# Patient Record
Sex: Female | Born: 1995 | Race: White | Hispanic: No | Marital: Married | Smoking: Never smoker
Health system: Southern US, Community
[De-identification: ages and names within clinical notes are randomized; demographics above are authoritative.]

## PROBLEM LIST (undated history)

## (undated) DIAGNOSIS — K219 Gastro-esophageal reflux disease without esophagitis: Secondary | ICD-10-CM

## (undated) DIAGNOSIS — O2301 Infections of kidney in pregnancy, first trimester: Secondary | ICD-10-CM

## (undated) HISTORY — PX: NO PAST SURGERIES: SHX2092

---
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
## 2016-03-14 IMAGING — MR MR PELVIS W/O CM
5 of 8 series · 19 of 48 positions shown · non-contrast
Comparison: None.

CLINICAL DATA: Patient sent over from [HOSPITAL] for further
evaluation of right sided abdominal pain. Pt reports recent
hospitalization for kidney infection at [facility]. UA at [HOSPITAL] today normal per pt. PT reports sudden onset of abdominal and
back pain yesterday. Denies recent fever. Pt vomited x3 today. Pt
also 13 weeks preg.

EXAM:
MRI ABDOMEN AND PELVIS WITHOUT CONTRAST
TECHNIQUE: Multiplanar multisequence MR imaging of the abdomen and pelvis was
performed. No intravenous contrast was administered.

[Series 4: T2 fat-sat · axial · 6.0mm · 0.74mm/px · z∈[-199,+179]mm · 5 of 55 slices shown (1 of 2)]
[im 1/55]
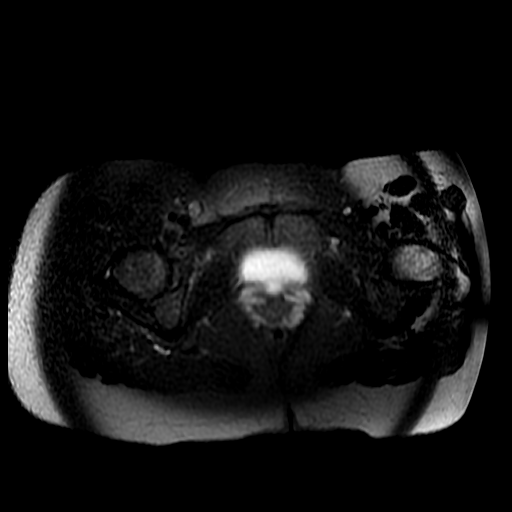
[im 14/55]
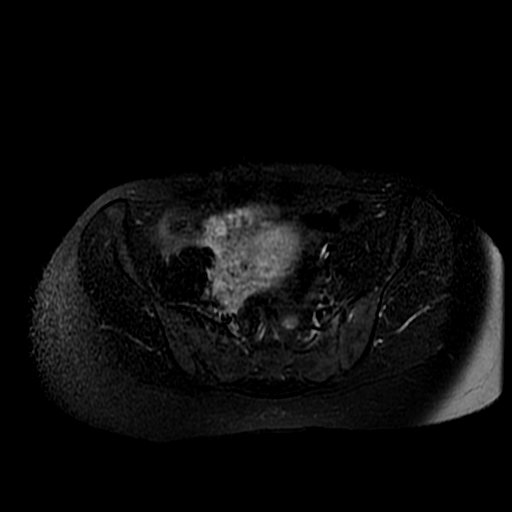
[im 28/55]
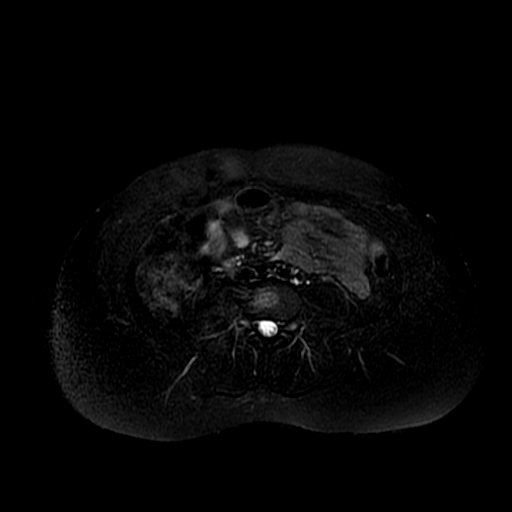
[im 41/55]
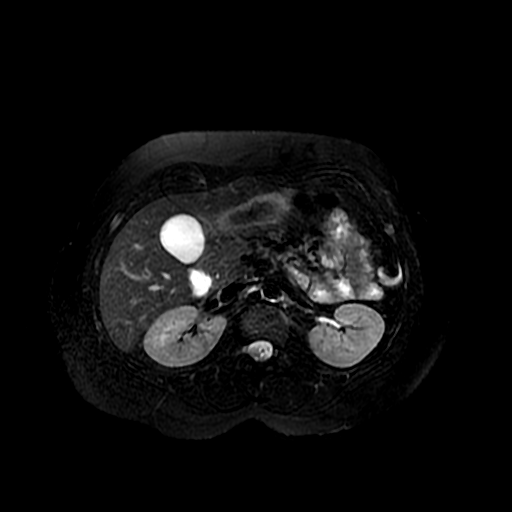
[im 55/55]
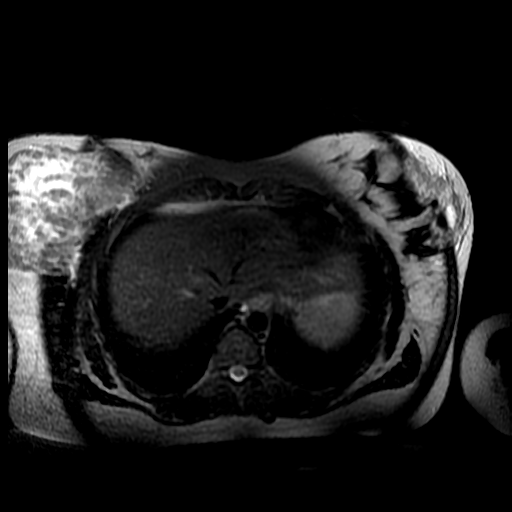

[Series 5: T2 fat-sat · axial · 6.0mm · 0.74mm/px · z∈[-199,+179]mm · 5 of 55 slices shown (2 of 2)]
[im 1/55]
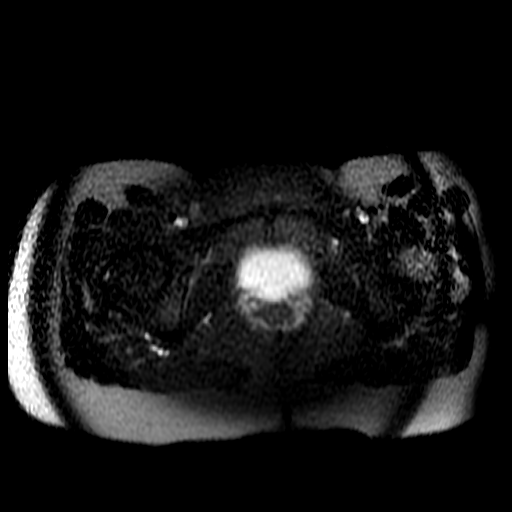
[im 14/55]
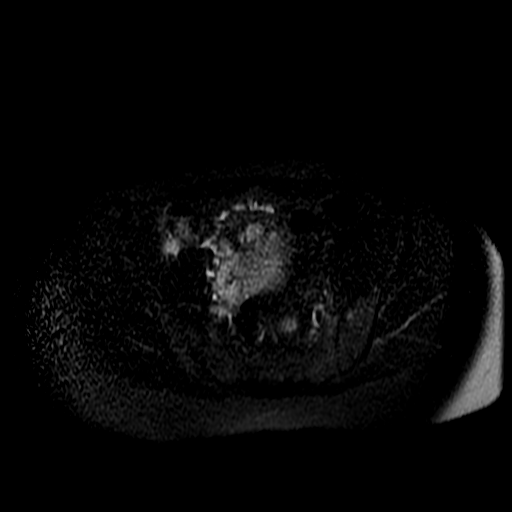
[im 28/55]
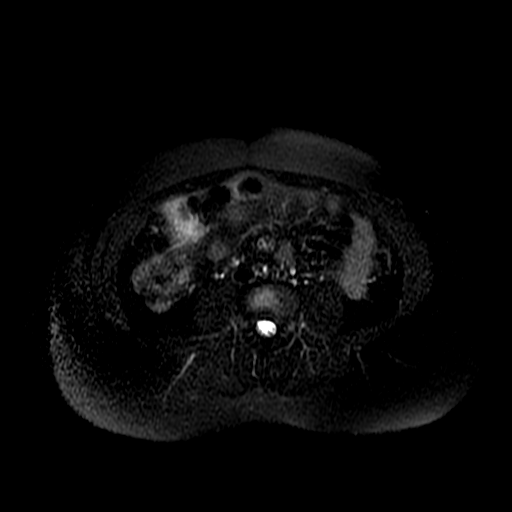
[im 41/55]
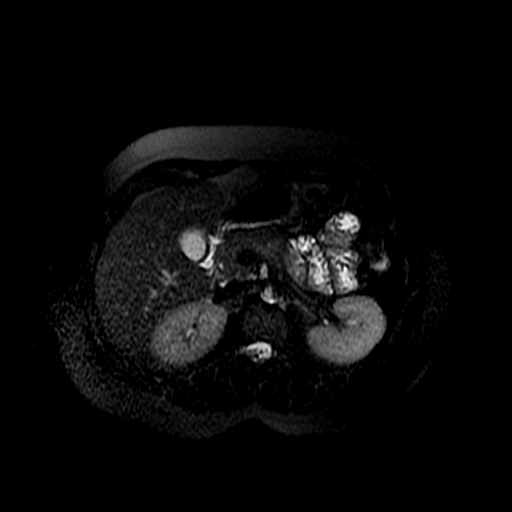
[im 55/55]
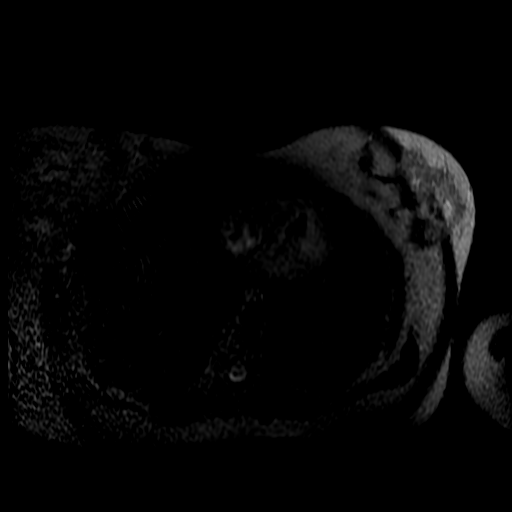

[Series 6: T2 · coronal · 10.0mm · 0.74mm/px · 2 of 21 slices shown]
[im 1/21]
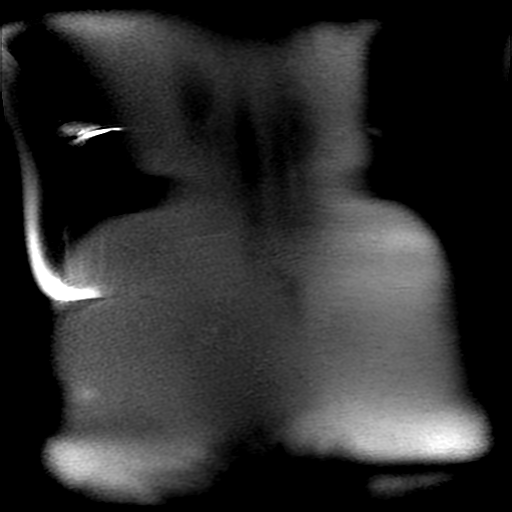
[im 21/21]
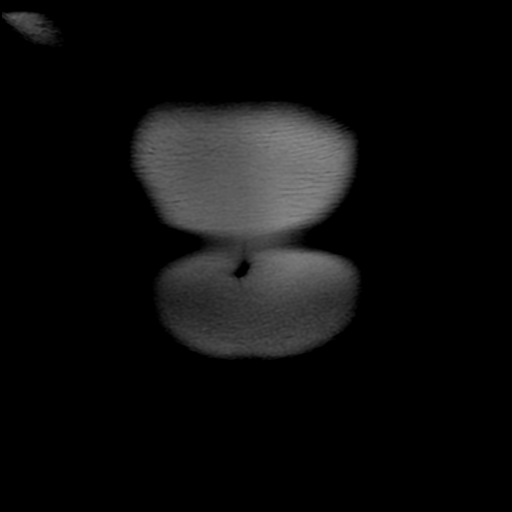

[Series 7: bSSFP · coronal · 10.0mm · 0.74mm/px · 2 of 21 slices shown]
[im 1/21]
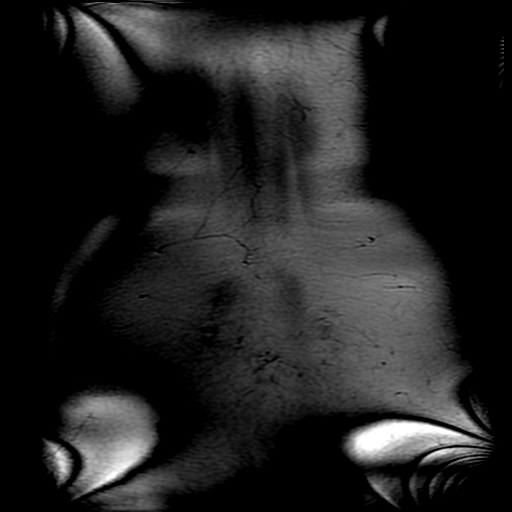
[im 21/21]
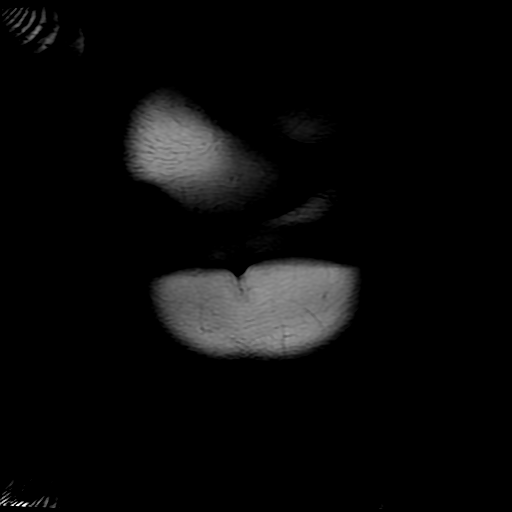

[Series 8: T1 dynamic · axial · 5.0mm · 0.78mm/px · z∈[-180,+27]mm · 5 of 156 slices shown]
[im 11/156]
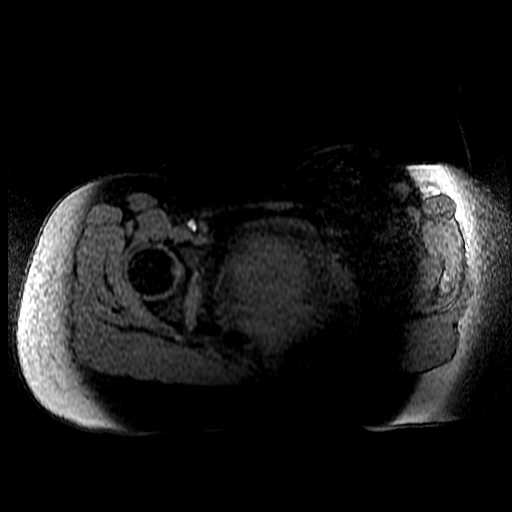
[im 32/156]
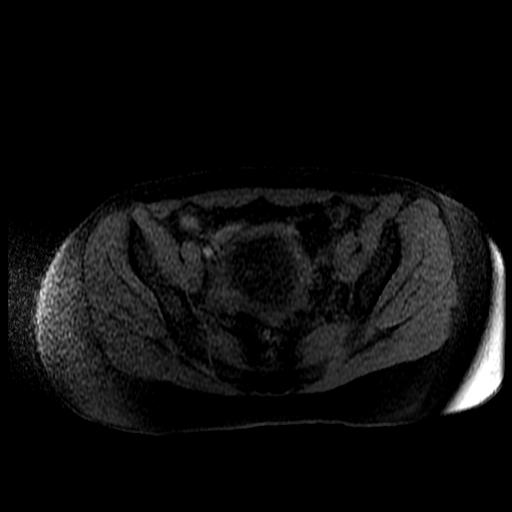
[im 52/156]
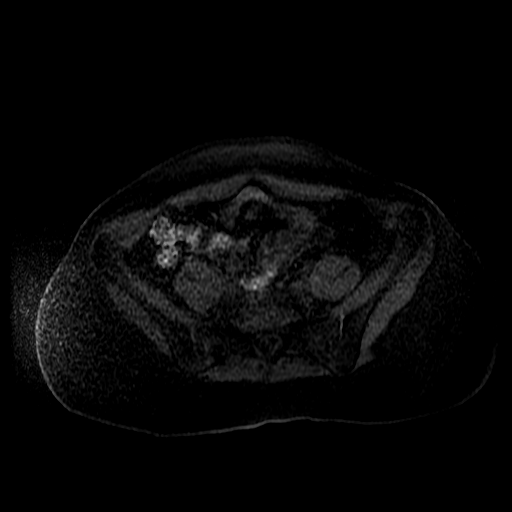
[im 73/156]
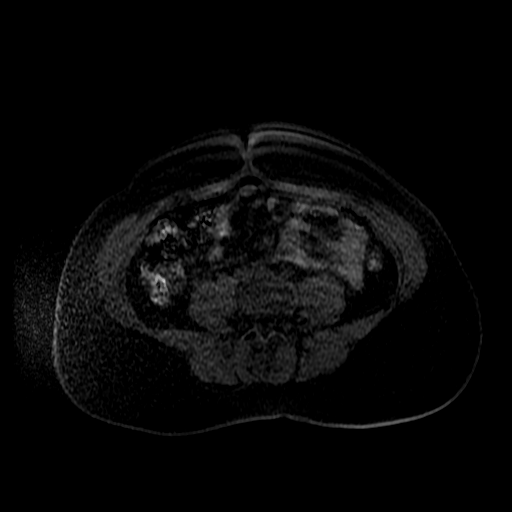
[im 94/156]
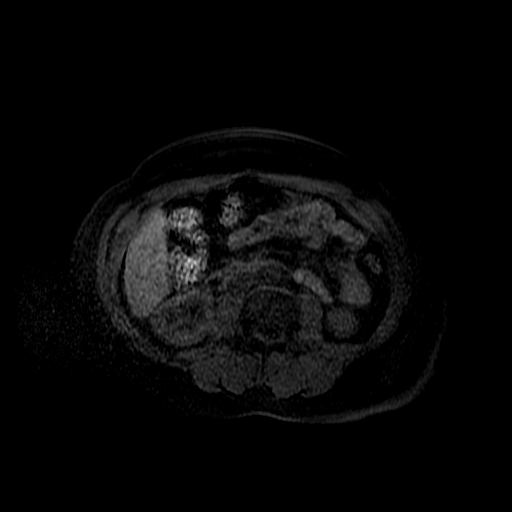

[19 of 48 positions shown; findings below may reference images not displayed]

FINDINGS: MRI ABDOMEN FINDINGS

Liver, spleen, gallbladder, pancreas, adrenal glands: Normal.

Normal kidneys.  No hydronephrosis.

Visualized bowel is unremarkable.  No adenopathy.  No ascites.

MRI PELVIS FINDINGS

Uterus enlarged by well-formed gestational sac containing an embryo.
Placenta is predominantly anterior. There is no evidence of a
subchorionic or retroplacental hemorrhage. No uterine masses.
Ovaries and adnexa are unremarkable.

The appendix is not definitively seen. There is no evidence acute
appendicitis or right lower quadrant inflammation.

Bowel in the pelvis is unremarkable. No pelvic masses or adenopathy.
No ascites. Visible portions of the ureters are normal in caliber.
Bladder is unremarkable.

MUSCULOSKELETAL

Normal.
IMPRESSION: 1. No evidence of acute appendicitis. Although the the absence
normal appendix is not discretely seen, inflammatory changes
adjacent to cecum is felt to exclude acute appendicitis.
2. No evidence of renal or ureteral stones or obstructive uropathy.
There are no findings to explain this patient's right-sided
abdominal pain and back pain.
3. Single live intrauterine pregnancy. No evidence of a pregnancy
complication.
4. Exam is normal for a 13 week pregnant patient.

## 2016-04-03 ENCOUNTER — Ambulatory Visit: Payer: Self-pay

## 2016-04-03 ENCOUNTER — Other Ambulatory Visit: Payer: Self-pay | Admitting: Occupational Medicine

## 2016-04-03 DIAGNOSIS — Z0000 Encounter for general adult medical examination without abnormal findings: Secondary | ICD-10-CM

## 2016-07-23 ENCOUNTER — Ambulatory Visit (INDEPENDENT_AMBULATORY_CARE_PROVIDER_SITE_OTHER): Payer: Managed Care, Other (non HMO) | Admitting: Obstetrics & Gynecology

## 2016-07-23 ENCOUNTER — Encounter: Payer: Self-pay | Admitting: Obstetrics & Gynecology

## 2016-07-23 VITALS — BP 110/60 | HR 93 | Ht 65.0 in | Wt 178.0 lb

## 2016-07-23 DIAGNOSIS — R1032 Left lower quadrant pain: Secondary | ICD-10-CM

## 2016-07-23 DIAGNOSIS — N939 Abnormal uterine and vaginal bleeding, unspecified: Secondary | ICD-10-CM

## 2016-07-23 NOTE — Patient Instructions (Signed)
Transvaginal Ultrasound A transvaginal ultrasound, also called an endovaginal ultrasound, is a test that uses harmless sound waves to take pictures of the female genital tract. The pictures are taken with a device, called a transducer, that is placed in the vagina. This test may be done to:  Check for problems with your pregnancy.  Examine your developing baby.  Check for anything abnormal in the uterus or ovaries.  Evaluate pelvic pain or bleeding. Tell a health care provider about:  Any allergies you have.  All medicines you are taking, including vitamins, herbs, eye drops, creams, and over-the-counter medicines.  Any blood disorders you have.  Any surgeries you have had.  Any medical conditions you have.  Whether you are pregnant or may be pregnant.  Whether you are having your period. What are the risks? There are no known risks or complications of having this test. What happens before the procedure?  This test needs to be done when your bladder is empty. Follow your health care provider's instructions about drinking fluids and emptying your bladder before the test. What happens during the procedure?  You will empty your bladder.  You will undress from the waist down.  You will lie down on an examining table, with your knees bent and your feet in foot holders.  A health care provider will cover the transducer with a sterile condom.  A gel will be put on the transducer. The gel helps transmit the sound waves and prevents irritation to your vagina.  The technician will insert the transducer into your vagina to get images. These will be displayed on a monitor that looks like a small television screen.  The transducer will be removed when the procedure is complete. What happens after the procedure?  It is your responsibility to get your test results. Ask your health care provider or the department performing the test when your results will be ready.  Keep follow-up  visits as told by your health care provider. This is important. This information is not intended to replace advice given to you by your health care provider. Make sure you discuss any questions you have with your health care provider. Document Released: 04/03/2004 Document Revised: 12/24/2015 Document Reviewed: 09/21/2014 Elsevier Interactive Patient Education  2017 Elsevier Inc.  

## 2016-07-23 NOTE — Progress Notes (Signed)
  History of Present Illness:  Anita Hurst is a 21 y.o. that had a Paragard IUD placed approximately 5 months ago. Since that time, she states that she has had some pain over the last 2 weeks;  LMP was Dec then last few days (h/o irreg periods).  No breast T, nausea, assoc sx's.  Cant feel string.  PMHx: She  has a past medical history of GERD (gastroesophageal reflux disease) and Pyelonephritis affecting pregnancy in first trimester, antepartum. Also,  has a past surgical history that includes No past surgeries., family history includes Breast cancer in her mother; Cancer in her mother; Diabetes in her maternal grandfather, maternal grandmother, and mother; Heart failure in her maternal grandmother; Ovarian cancer in her maternal grandmother.,  reports that she has never smoked. She has never used smokeless tobacco. She reports that she does not drink alcohol or use drugs.  She has a current medication list which includes the following prescription(s): copper and ranitidine. Also, is allergic to bee venom and sulfa antibiotics.  Review of Systems  Constitutional: Negative for chills, fever and malaise/fatigue.  HENT: Negative for congestion, sinus pain and sore throat.   Eyes: Negative for blurred vision and pain.  Respiratory: Negative for cough and wheezing.   Cardiovascular: Negative for chest pain and leg swelling.  Gastrointestinal: Negative for abdominal pain, constipation, diarrhea, heartburn, nausea and vomiting.  Genitourinary: Negative for dysuria, frequency, hematuria and urgency.  Musculoskeletal: Negative for back pain, joint pain, myalgias and neck pain.  Skin: Negative for itching and rash.  Neurological: Negative for dizziness, tremors and weakness.  Endo/Heme/Allergies: Does not bruise/bleed easily.  Psychiatric/Behavioral: Negative for depression. The patient is not nervous/anxious and does not have insomnia.     Physical Exam:  BP 110/60   Pulse 93   Ht 5\' 5"   (1.651 m)   Wt 178 lb (80.7 kg)   LMP 07/11/2016   Breastfeeding? Unknown   BMI 29.62 kg/m  Body mass index is 29.62 kg/m. Constitutional: Well nourished, well developed female in no acute distress.  Abdomen: diffusely non tender to palpation, non distended, and no masses, hernias Neuro: Grossly intact Psych:  Normal mood and affect.    Pelvic exam:  Two IUD strings present seen coming from the cervical os. EGBUS, vaginal vault and cervix: within normal limits  Assessment: IUD strings present in proper location; pt still has LLQ pain and irreg bleeding.  Does not want hormones.  Plan: TVUS to assess pain and bleeding.     She was told to continue to use barrier contraception, in order to prevent any STIs, and to take a home pregnancy test or call us if she ever thinks she may be pregnant, and that her IUD expires in 9 years.   Annamarie MajorPaul Muadh Creasy, MD, Merlinda FrederickFACOG [facility], [facility] 07/23/2016  11:15 AM

## 2016-07-29 ENCOUNTER — Ambulatory Visit (INDEPENDENT_AMBULATORY_CARE_PROVIDER_SITE_OTHER): Payer: Managed Care, Other (non HMO) | Admitting: Obstetrics & Gynecology

## 2016-07-29 ENCOUNTER — Ambulatory Visit (INDEPENDENT_AMBULATORY_CARE_PROVIDER_SITE_OTHER): Payer: Managed Care, Other (non HMO)

## 2016-07-29 ENCOUNTER — Encounter: Payer: Self-pay | Admitting: Obstetrics & Gynecology

## 2016-07-29 VITALS — BP 118/72 | HR 92 | Ht 66.0 in | Wt 180.0 lb

## 2016-07-29 DIAGNOSIS — R1032 Left lower quadrant pain: Secondary | ICD-10-CM

## 2016-07-29 DIAGNOSIS — N926 Irregular menstruation, unspecified: Secondary | ICD-10-CM

## 2016-07-29 DIAGNOSIS — N939 Abnormal uterine and vaginal bleeding, unspecified: Secondary | ICD-10-CM

## 2016-07-29 NOTE — Progress Notes (Signed)
  HPI: Abdominal Pain Patient presents for evaluation of abdominal pain. The pain is described as stabbing, and is 4/10 in intensity. Pain is located in the LLQ area without radiation. Onset was intermittent occurring 1 month ago. Symptoms have been intermittant since. Aggravating factors: getting US today, certain positions. Alleviating factors: recumbency. Associated symptoms: none. The patient denies constipation, diarrhea, dysuria, fever, frequency, nausea and vomiting. Risk factors for pelvic/abdominal pain include IUD in place.   Ultrasound demonstrates no masses seen, no cysts, and IUD ib proper orientation; scant FF in pelvis  These findings are Pelvis normal other than th efree fluid  PMHx: She  has a past medical history of GERD (gastroesophageal reflux disease) and Pyelonephritis affecting pregnancy in first trimester, antepartum. Also,  has a past surgical history that includes No past surgeries., family history includes Breast cancer in her mother; Cancer in her mother; Diabetes in her maternal grandfather, maternal grandmother, and mother; Heart failure in her maternal grandmother; Ovarian cancer in her maternal grandmother.,  reports that she has never smoked. She has never used smokeless tobacco. She reports that she does not drink alcohol or use drugs.  She has a current medication list which includes the following prescription(s): copper and ranitidine. Also, is allergic to bee venom and sulfa antibiotics.  Review of Systems  Constitutional: Negative for chills, fever and malaise/fatigue.  HENT: Negative for congestion, sinus pain and sore throat.   Eyes: Negative for blurred vision and pain.  Respiratory: Negative for cough and wheezing.   Cardiovascular: Negative for chest pain and leg swelling.  Gastrointestinal: Negative for abdominal pain, constipation, diarrhea, heartburn, nausea and vomiting.  Genitourinary: Negative for dysuria, frequency, hematuria and urgency.    Musculoskeletal: Negative for back pain, joint pain, myalgias and neck pain.  Skin: Negative for itching and rash.  Neurological: Negative for dizziness, tremors and weakness.  Endo/Heme/Allergies: Does not bruise/bleed easily.  Psychiatric/Behavioral: Negative for depression. The patient is not nervous/anxious and does not have insomnia.     Objective: BP 118/72 (BP Location: Right Arm, Patient Position: Sitting, Cuff Size: Normal)   Pulse 92   Ht 5\' 6"  (1.676 m)   Wt 180 lb (81.6 kg)   LMP 07/11/2016 (Exact Date)   Breastfeeding? No   BMI 29.05 kg/m   Physical examination Constitutional NAD, Conversant  Skin No rashes, lesions or ulceration.   Extremities: Moves all appropriately.  Normal ROM for age. No lymphadenopathy.  Neuro: Grossly intact  Psych: Oriented to PPT.  Normal mood. Normal affect.   Assessment:  Left lower quadrant pain MS pain as source; also dysmenorrhea with her irreg rare period cycles PT if persists and we cont to think MS as main etiology Heat, rest, Tylenol. Consider switch to hormonal IUD if periods always more related to pain she has, or if menorrhagia becomes a pattern. Cont Paraguard for now.  Annamarie MajorPaul Vicky Schleich, MD, Merlinda FrederickFACOG [facility], [facility] 07/29/2016  3:59 PM

## 2017-05-06 HISTORY — PX: WISDOM TOOTH EXTRACTION: SHX21

## 2017-10-26 IMAGING — US US ART/VEN ABD/PELV/SCROTUM DOPPLER LTD
1 series · 13 of 25 positions shown · non-contrast
Comparison: none

CLINICAL DATA: [REDACTED] weeks pregnant with right lower quadrant
pain. Rule out ovarian torsion.

EXAM:
LIMITED OBSTETRIC ULTRASOUND AN DOPPLER

[Series 1: us art/ven abd/pelv/scrotum doppler ltd · 0.23mm/px · 60 acquisitions, 13 frames shown]
[im 1/60]
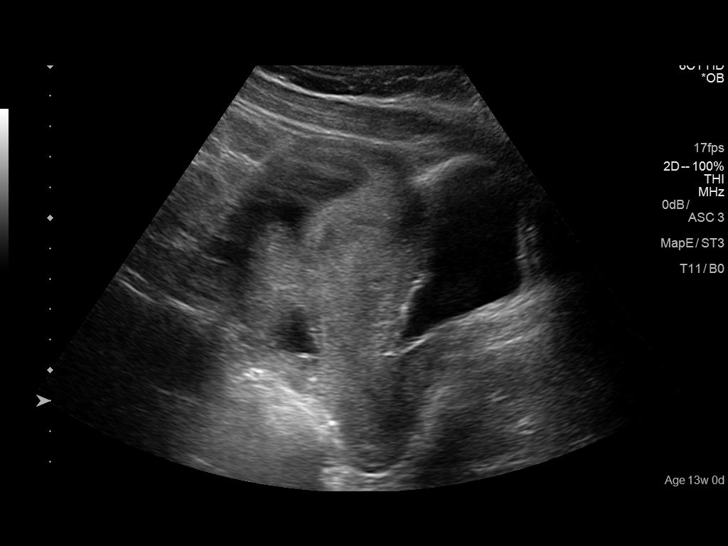
[im 5/60]
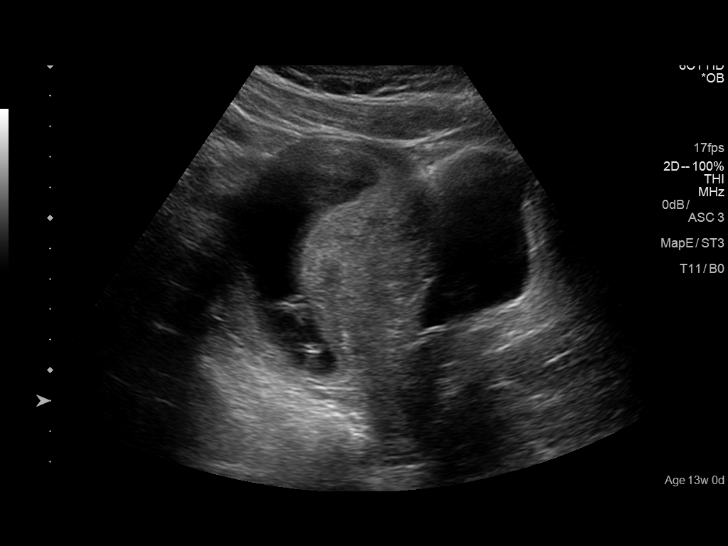
[im 10/60]
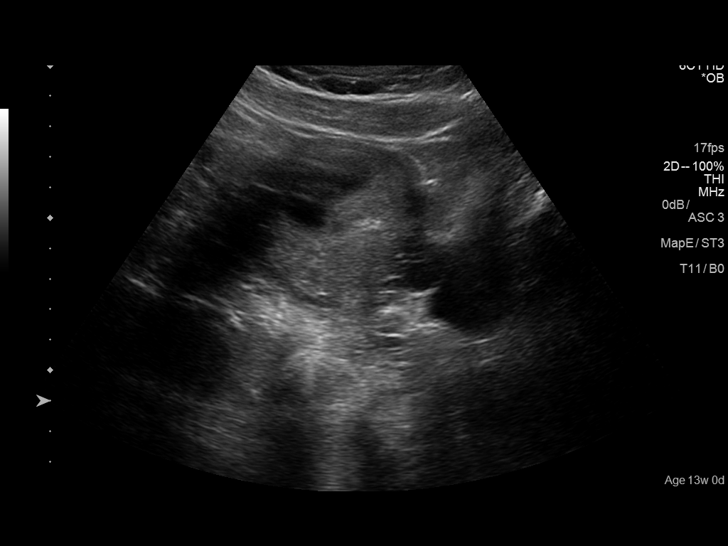
[im 15/60]
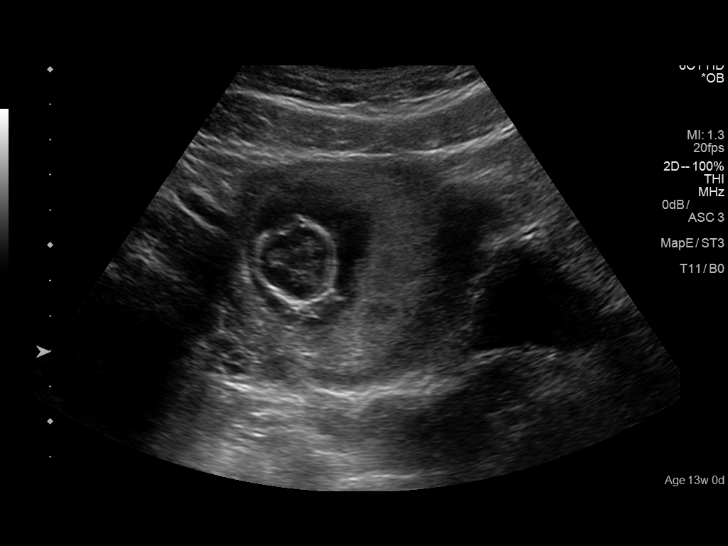
[im 20/60]
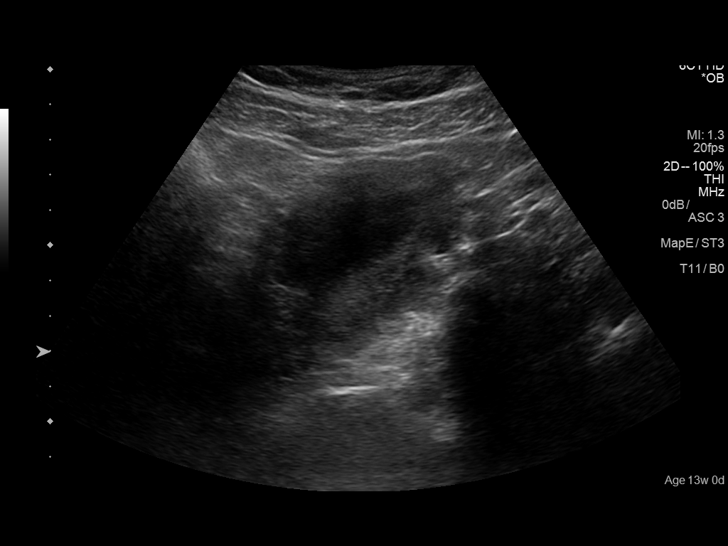
[im 25/60]
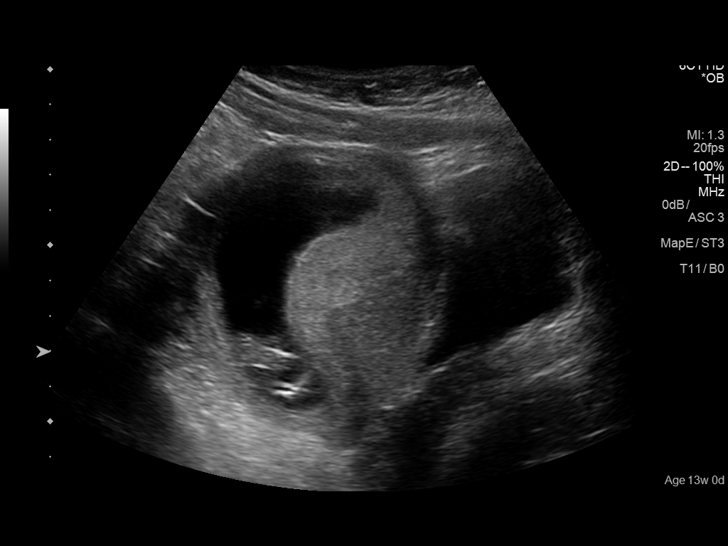
[im 30/60]
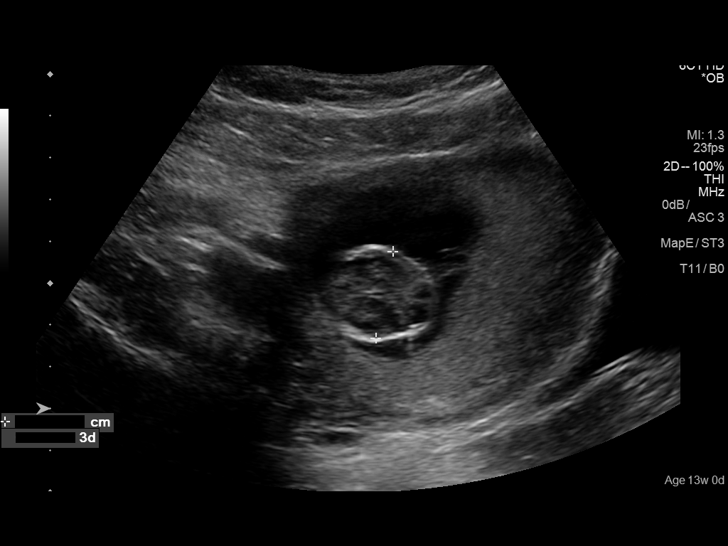
[im 35/60]
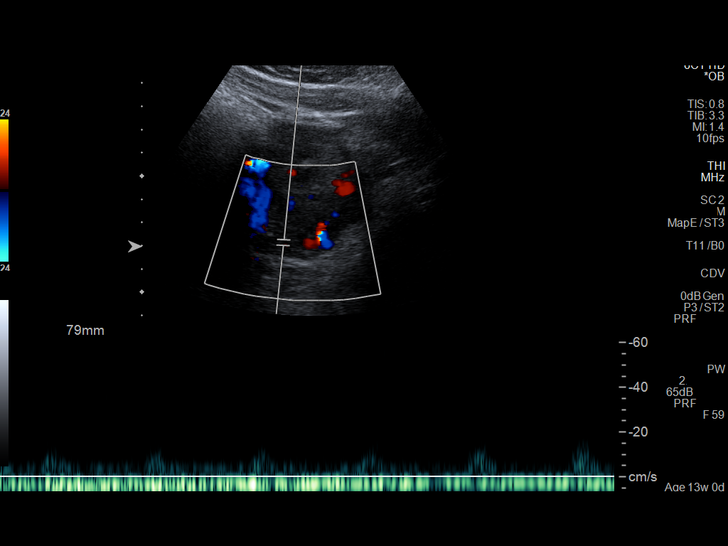
[im 40/60]
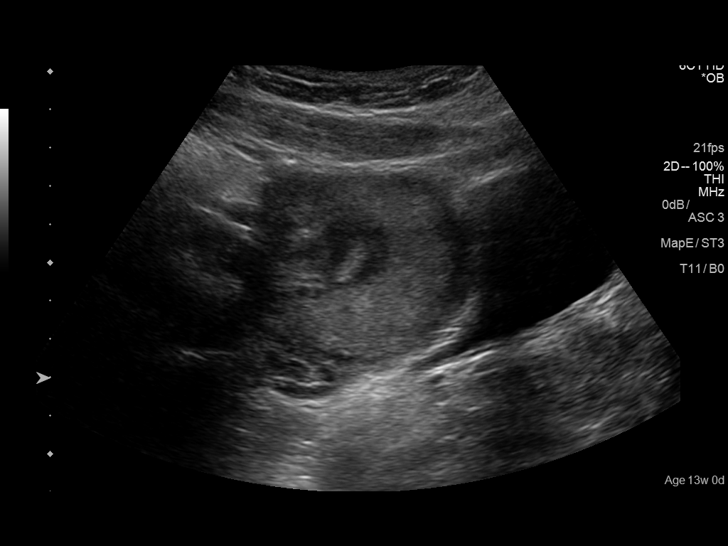
[im 45/60]
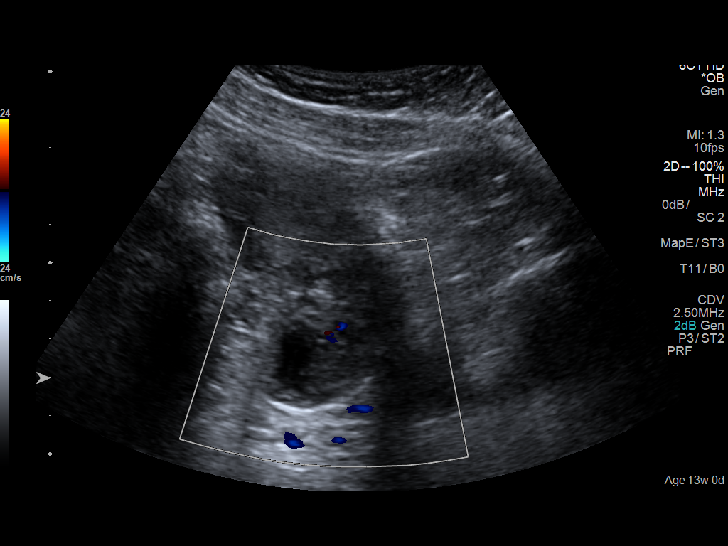
[im 50/60]
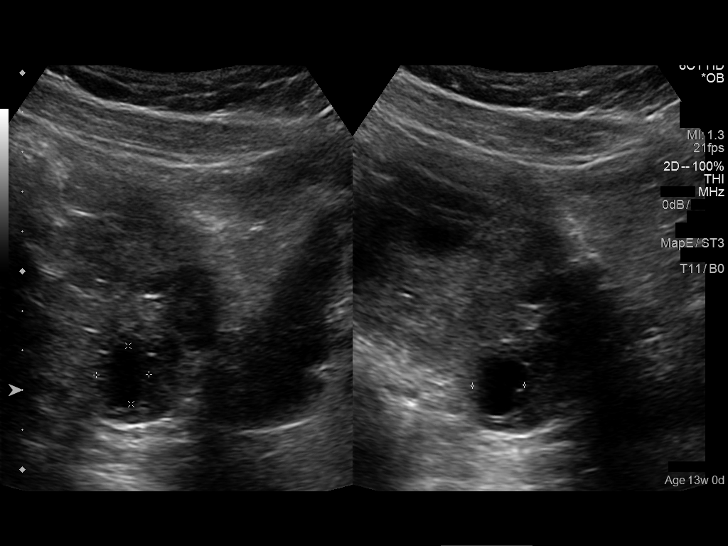
[im 55/60]
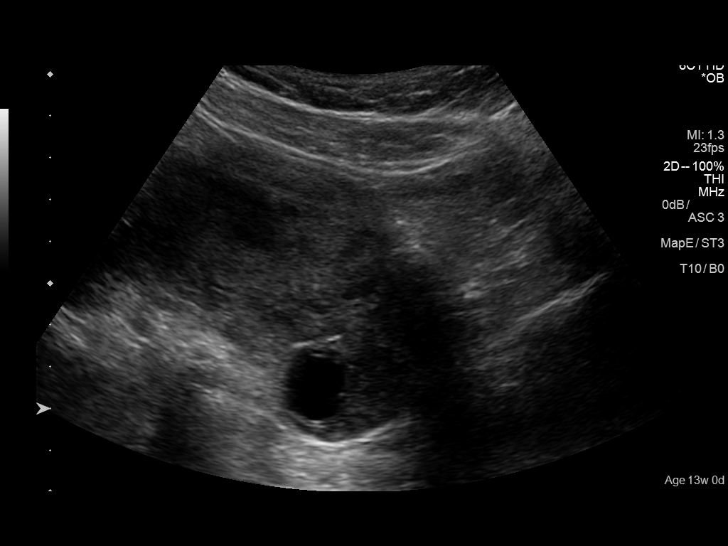
[im 60/60]
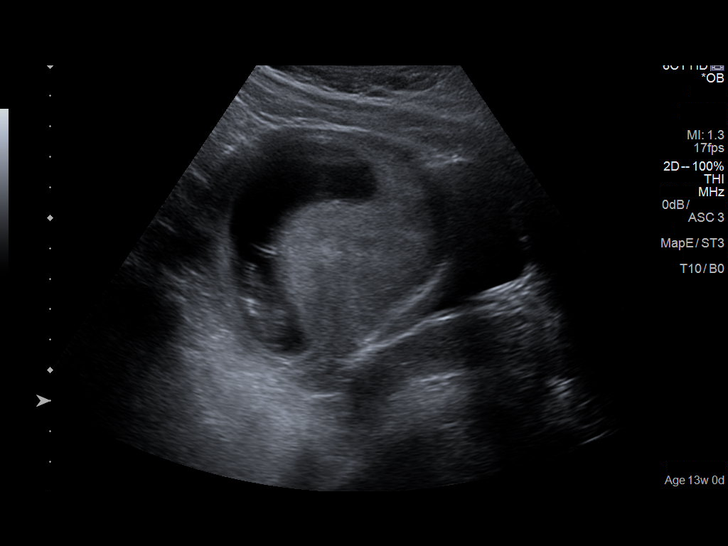

[13 of 25 positions shown; findings below may reference images not displayed]

FINDINGS: Number of Fetuses: 1

Heart Rate:  160 bpm

Movement: Yes

Presentation: Variable

Placental Location: Anterior

Previa: No

Amniotic Fluid (Subjective):  Within normal limits.

BPD:  2.1cm 13w  2d

MATERNAL FINDINGS:

Cervix:  Appears closed.

Uterus/Adnexae: Uterus measures 10.8 x 7.2 x 7.9 cm. Right ovary is
unremarkable measuring 3.8 x 2.3 x 2.7 cm. Left ovary is
unremarkable measuring 3.3 x 2.1 x 2.3 cm. Dominant, 15 mm, left
ovarian cyst.

Pulsed Doppler evaluation of both ovaries demonstrates normal
low-resistance arterial and venous waveforms.

Other findings

No free fluid.
IMPRESSION: 1. No acute findings.  Normal ovaries with no evidence of torsion.
2. Single live intrauterine pregnancy with a measured gestational
age of 13 weeks and 2 days consistent with the expected gestational
age based on the last menstrual period. No evidence of a pregnancy
complication.

This exam is performed on an emergent basis and does not
comprehensively evaluate fetal size, dating, or anatomy; follow-up
complete OB US should be considered if further fetal assessment is
warranted.

## 2018-03-30 ENCOUNTER — Telehealth: Payer: Self-pay

## 2018-03-30 NOTE — Telephone Encounter (Signed)
Pt has copper IUD and has been bleeding x3wks.  [phone].

## 2018-03-31 NOTE — Telephone Encounter (Signed)
Pt states she has had her iud for a year, had a period then has been spotting for 3 weeks, lite just there just when she wipes, this is the first time this has happened, pt states she can feel the iud strings, please advise

## 2018-04-01 NOTE — Telephone Encounter (Signed)
Spoke with patient. Reassurance given and recommended she call with worsening symptoms and come to clinic PRN.

## 2019-03-10 ENCOUNTER — Other Ambulatory Visit: Payer: Self-pay

## 2019-03-10 ENCOUNTER — Encounter: Payer: Self-pay | Admitting: Certified Nurse Midwife

## 2019-03-10 ENCOUNTER — Other Ambulatory Visit (HOSPITAL_COMMUNITY)
Admission: RE | Admit: 2019-03-10 | Discharge: 2019-03-10 | Disposition: A | Discharge status: Home / Self Care | Payer: Managed Care, Other (non HMO) | Source: Ambulatory Visit | Attending: Certified Nurse Midwife | Admitting: Certified Nurse Midwife

## 2019-03-10 ENCOUNTER — Ambulatory Visit: Payer: Managed Care, Other (non HMO) | Admitting: Certified Nurse Midwife

## 2019-03-10 VITALS — BP 125/98 | HR 100 | Ht 66.0 in | Wt 178.6 lb

## 2019-03-10 DIAGNOSIS — Z124 Encounter for screening for malignant neoplasm of cervix: Secondary | ICD-10-CM | POA: Diagnosis present

## 2019-03-10 DIAGNOSIS — Z803 Family history of malignant neoplasm of breast: Secondary | ICD-10-CM | POA: Insufficient documentation

## 2019-03-10 DIAGNOSIS — Z01419 Encounter for gynecological examination (general) (routine) without abnormal findings: Secondary | ICD-10-CM

## 2019-03-10 DIAGNOSIS — N390 Urinary tract infection, site not specified: Secondary | ICD-10-CM | POA: Diagnosis not present

## 2019-03-10 DIAGNOSIS — Z8759 Personal history of other complications of pregnancy, childbirth and the puerperium: Secondary | ICD-10-CM | POA: Diagnosis not present

## 2019-03-10 NOTE — Addendum Note (Signed)
Addended by: Hildred Priest on: 03/10/2019 04:35 PM   Modules accepted: Orders

## 2019-03-10 NOTE — Patient Instructions (Signed)
Preventive Care 27-22 Years Old, Female Preventive care refers to visits with your health care provider and lifestyle choices that can promote health and wellness. This includes:  A yearly physical exam. This may also be called an annual well check.  Regular dental visits and eye exams.  Immunizations.  Screening for certain conditions.  Healthy lifestyle choices, such as eating a healthy diet, getting regular exercise, not using drugs or products that contain nicotine and tobacco, and limiting alcohol use. What can I expect for my preventive care visit? Physical exam Your health care provider will check your:  Height and weight. This may be used to calculate body mass index (BMI), which tells if you are at a healthy weight.  Heart rate and blood pressure.  Skin for abnormal spots. Counseling Your health care provider may ask you questions about your:  Alcohol, tobacco, and drug use.  Emotional well-being.  Home and relationship well-being.  Sexual activity.  Eating habits.  Work and work Statistician.  Method of birth control.  Menstrual cycle.  Pregnancy history. What immunizations do I need?  Influenza (flu) vaccine  This is recommended every year. Tetanus, diphtheria, and pertussis (Tdap) vaccine  You may need a Td booster every 10 years. Varicella (chickenpox) vaccine  You may need this if you have not been vaccinated. Human papillomavirus (HPV) vaccine  If recommended by your health care provider, you may need three doses over 6 months. Measles, mumps, and rubella (MMR) vaccine  You may need at least one dose of MMR. You may also need a second dose. Meningococcal conjugate (MenACWY) vaccine  One dose is recommended if you are age 25-21 years and a first-year college student living in a residence hall, or if you have one of several medical conditions. You may also need additional booster doses. Pneumococcal conjugate (PCV13) vaccine  You may need  this if you have certain conditions and were not previously vaccinated. Pneumococcal polysaccharide (PPSV23) vaccine  You may need one or two doses if you smoke cigarettes or if you have certain conditions. Hepatitis A vaccine  You may need this if you have certain conditions or if you travel or work in places where you may be exposed to hepatitis A. Hepatitis B vaccine  You may need this if you have certain conditions or if you travel or work in places where you may be exposed to hepatitis B. Haemophilus influenzae type b (Hib) vaccine  You may need this if you have certain conditions. You may receive vaccines as individual doses or as more than one vaccine together in one shot (combination vaccines). Talk with your health care provider about the risks and benefits of combination vaccines. What tests do I need?  Blood tests  Lipid and cholesterol levels. These may be checked every 5 years starting at age 67.  Hepatitis C test.  Hepatitis B test. Screening  Diabetes screening. This is done by checking your blood sugar (glucose) after you have not eaten for a while (fasting).  Sexually transmitted disease (STD) testing.  BRCA-related cancer screening. This may be done if you have a family history of breast, ovarian, tubal, or peritoneal cancers.  Pelvic exam and Pap test. This may be done every 3 years starting at age 109. Starting at age 38, this may be done every 5 years if you have a Pap test in combination with an HPV test. Talk with your health care provider about your test results, treatment options, and if necessary, the need for more tests.  Follow these instructions at home: Eating and drinking   Eat a diet that includes fresh fruits and vegetables, whole grains, lean protein, and low-fat dairy.  Take vitamin and mineral supplements as recommended by your health care provider.  Do not drink alcohol if: ? Your health care provider tells you not to drink. ? You are  pregnant, may be pregnant, or are planning to become pregnant.  If you drink alcohol: ? Limit how much you have to 0-1 drink a day. ? Be aware of how much alcohol is in your drink. In the [place] one drink equals one 12 oz bottle of beer (355 mL), one 5 oz glass of wine (148 mL), or one 1 oz glass of hard liquor (44 mL). Lifestyle  Take daily care of your teeth and gums.  Stay active. Exercise for at least 30 minutes on 5 or more days each week.  Do not use any products that contain nicotine or tobacco, such as cigarettes, e-cigarettes, and chewing tobacco. If you need help quitting, ask your health care provider.  If you are sexually active, practice safe sex. Use a condom or other form of birth control (contraception) in order to prevent pregnancy and STIs (sexually transmitted infections). If you plan to become pregnant, see your health care provider for a preconception visit. What's next?  Visit your health care provider once a year for a well check visit.  Ask your health care provider how often you should have your eyes and teeth checked.  Stay up to date on all vaccines. This information is not intended to replace advice given to you by your health care provider. Make sure you discuss any questions you have with your health care provider. Document Released: 06/18/2001 Document Revised: 01/01/2018 Document Reviewed: 01/01/2018 Elsevier Patient Education  2020 Elsevier Inc.  

## 2019-03-10 NOTE — Progress Notes (Signed)
GYNECOLOGY ANNUAL PREVENTATIVE CARE ENCOUNTER NOTE  History:     Anita Hurst is a 23 y.o. G43P0101 female here for a routine annual gynecologic exam.  Current complaints:none   Denies abnormal vaginal bleeding, discharge, pelvic pain, problems with intercourse or other gynecologic concerns.    Gynecologic History Patient's last menstrual period was 02/05/2019 (approximate). Contraception: IUD Last Pap: 3 yrs ago. Results were: normal per pt Last mammogram: n/a , pt would like mammogram, pt mother passed away from breast cancer   Obstetric History OB History  Gravida Para Term Preterm AB Living  1 1   1   1   SAB TAB Ectopic Multiple Live Births          1    # Outcome Date GA Lbr Len/2nd Weight Sex Delivery Anes PTL Lv  1 Preterm 11/05/15 [redacted]w[redacted]d   M CS-LTranv  Y LIV    Past Medical History:  Diagnosis Date  . GERD (gastroesophageal reflux disease)   . Pyelonephritis affecting pregnancy in first trimester, antepartum     Past Surgical History:  Procedure Laterality Date  . NO PAST SURGERIES      Current Outpatient Medications on File Prior to Visit  Medication Sig Dispense Refill  . busPIRone (BUSPAR) 7.5 MG tablet Take 7.5 mg by mouth daily.    Marland Kitchen PARAGARD INTRAUTERINE COPPER IU by Intrauterine route.    . ranitidine (ZANTAC) 150 MG tablet Take by mouth.     No current facility-administered medications on file prior to visit.     Allergies  Allergen Reactions  . Bee Venom Hives and Rash  . Sulfa Antibiotics Hives and Rash    Social History:  reports that she has never smoked. She has never used smokeless tobacco. She reports that she does not drink alcohol or use drugs.  Family History  Problem Relation Age of Onset  . Cancer Mother        Breast  . Diabetes Mother   . Breast cancer Mother   . Diabetes Maternal Grandmother   . Heart failure Maternal Grandmother   . Ovarian cancer Maternal Grandmother   . Diabetes Maternal Grandfather     The  following portions of the patient's history were reviewed and updated as appropriate: allergies, current medications, past family history, past medical history, past social history, past surgical history and problem list.  Review of Systems Pertinent items noted in HPI and remainder of comprehensive ROS otherwise negative.  Physical Exam:  BP (!) 125/98   Pulse 100   Ht 5\' 6"  (1.676 m)   Wt 178 lb 9 oz (81 kg)   LMP 02/05/2019 (Approximate)   BMI 28.82 kg/m  CONSTITUTIONAL: Well-developed, well-nourished over weight female in no acute distress.  HENT:  Normocephalic, atraumatic, External right and left ear normal. Oropharynx is clear and moist EYES: Conjunctivae and EOM are normal. Pupils are equal, round, and reactive to light. No scleral icterus.  NECK: Normal range of motion, supple, no masses.  Normal thyroid.  SKIN: Skin is warm and dry. No rash noted. Not diaphoretic. No erythema. No pallor. MUSCULOSKELETAL: Normal range of motion. No tenderness.  No cyanosis, clubbing, or edema.  2+ distal pulses. NEUROLOGIC: Alert and oriented to person, place, and time. Normal reflexes, muscle tone coordination. No cranial nerve deficit noted. PSYCHIATRIC: Normal mood and affect. Normal behavior. Normal judgment and thought content. CARDIOVASCULAR: Normal heart rate noted, regular rhythm RESPIRATORY: Clear to auscultation bilaterally. Effort and breath sounds normal, no problems with  respiration noted. BREASTS: Symmetric in size. No masses, skin changes, nipple drainage, or lymphadenopathy. ABDOMEN: Soft, normal bowel sounds, no distention noted.  No tenderness, rebound or guarding.  PELVIC: Normal appearing external genitalia; normal appearing vaginal mucosa and cervix.  No abnormal discharge noted.  Pap smear obtained.  Normal uterine size, no other palpable masses, no uterine or adnexal tenderness.   Assessment and Plan:  Annual Well Women GYN Exam  Will follow up results of pap smear and  manage accordingly. 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 with last pregnancy. She asked about reoccurrence. All questions answered.  Routine preventative health maintenance measures emphasized. Please refer to After Visit Summary for other counseling recommendations.      Doreene Burke, CNM

## 2019-03-12 LAB — CYTOLOGY - PAP: Diagnosis: NEGATIVE

## 2019-04-16 ENCOUNTER — Telehealth: Payer: Self-pay

## 2019-04-16 NOTE — Telephone Encounter (Signed)
[facility] called to inform the doctor that patient is scheduled for genetic counseling on Dec on 27th. Please call [facility] once counseling is complete. Submit specimen [id number]

## 2019-04-27 ENCOUNTER — Other Ambulatory Visit: Payer: Self-pay | Admitting: Certified Nurse Midwife

## 2019-04-27 DIAGNOSIS — Z1231 Encounter for screening mammogram for malignant neoplasm of breast: Secondary | ICD-10-CM

## 2019-05-05 DIAGNOSIS — Z1231 Encounter for screening mammogram for malignant neoplasm of breast: Secondary | ICD-10-CM

## 2019-06-04 ENCOUNTER — Ambulatory Visit: Payer: Managed Care, Other (non HMO) | Admitting: Certified Nurse Midwife

## 2019-08-02 ENCOUNTER — Other Ambulatory Visit: Payer: Self-pay

## 2019-08-02 ENCOUNTER — Encounter: Payer: Self-pay | Admitting: Certified Nurse Midwife

## 2019-08-02 ENCOUNTER — Ambulatory Visit: Payer: Managed Care, Other (non HMO) | Admitting: Certified Nurse Midwife

## 2019-08-02 VITALS — BP 119/89 | HR 65 | Ht 66.0 in | Wt 179.3 lb

## 2019-08-02 DIAGNOSIS — Z30432 Encounter for removal of intrauterine contraceptive device: Secondary | ICD-10-CM | POA: Diagnosis not present

## 2019-08-02 NOTE — Patient Instructions (Signed)

## 2019-08-02 NOTE — Progress Notes (Signed)
   GYNECOLOGY OFFICE PROCEDURE NOTE  Anita Hurst is a 24 y.o. G1P0101 here for para gaurd IUD removal. No GYN concerns.  Last pap smear was on 03/10/2019 and was normal.  IUD Removal  Patient identified, informed consent performed, consent signed.  Patient was in the dorsal lithotomy position, normal external genitalia was noted.  A speculum was placed in the patient's vagina, normal discharge was noted, no lesions. The cervix was visualized, no lesions, no abnormal discharge.  The strings of the IUD were grasped and pulled using ring forceps. The IUD was removed in its entirety. ).  Patient tolerated the procedure well.    The patient plans for pregnancy soon and she was told to avoid teratogens, take PNV and folic acid.  Routine preventative health maintenance measures emphasized.   Doreene Burke, CNM

## 2019-09-06 ENCOUNTER — Encounter: Payer: Managed Care, Other (non HMO) | Admitting: Certified Nurse Midwife

## 2019-10-05 ENCOUNTER — Encounter: Payer: Self-pay | Admitting: Certified Nurse Midwife

## 2019-10-05 ENCOUNTER — Ambulatory Visit (INDEPENDENT_AMBULATORY_CARE_PROVIDER_SITE_OTHER): Payer: Managed Care, Other (non HMO) | Admitting: Certified Nurse Midwife

## 2019-10-05 ENCOUNTER — Other Ambulatory Visit: Payer: Self-pay

## 2019-10-05 VITALS — BP 118/85 | HR 85 | Ht 66.0 in | Wt 181.2 lb

## 2019-10-05 DIAGNOSIS — Z01419 Encounter for gynecological examination (general) (routine) without abnormal findings: Secondary | ICD-10-CM | POA: Diagnosis not present

## 2019-10-05 NOTE — Patient Instructions (Signed)

## 2019-10-05 NOTE — Progress Notes (Signed)
GYNECOLOGY ANNUAL PREVENTATIVE CARE ENCOUNTER NOTE  History:     Anita Hurst is a 24 y.o. G69P0101 female here for a routine annual gynecologic exam.  Current complaints: trying to concieve.   Denies abnormal vaginal bleeding, discharge, pelvic pain, problems with intercourse or other gynecologic concerns.     Social  Relationship;married with child  Living: husband and child Work: FT Teacher, English as a foreign language  Exercise: jogging 3 x wk x 2 miles, swimming  Smoke/vape: denies  Alcohol: occasional  Drug use: none  Gynecologic History No LMP recorded. (Menstrual status: IUD). Contraception: none Last Pap: 03/10/19. Results were: normal  Last mammogram: n/a  Obstetric History OB History  Gravida Para Term Preterm AB Living  1 1   1   1   SAB TAB Ectopic Multiple Live Births          1    # Outcome Date GA Lbr Len/2nd Weight Sex Delivery Anes PTL Lv  1 Preterm 11/05/15 [redacted]w[redacted]d   M CS-LTranv  Y LIV    Past Medical History:  Diagnosis Date  . GERD (gastroesophageal reflux disease)   . Pyelonephritis affecting pregnancy in first trimester, antepartum     Past Surgical History:  Procedure Laterality Date  . CESAREAN SECTION    . NO PAST SURGERIES      Current Outpatient Medications on File Prior to Visit  Medication Sig Dispense Refill  . busPIRone (BUSPAR) 7.5 MG tablet Take 7.5 mg by mouth daily.    Marland Kitchen PARAGARD INTRAUTERINE COPPER IU by Intrauterine route.    . ranitidine (ZANTAC) 150 MG tablet Take by mouth.     No current facility-administered medications on file prior to visit.    Allergies  Allergen Reactions  . Bee Venom Hives and Rash  . Sulfa Antibiotics Hives and Rash    Social History:  reports that she has never smoked. She has never used smokeless tobacco. She reports that she does not drink alcohol or use drugs.  Family History  Problem Relation Age of Onset  . Cancer Mother        Breast  . Diabetes Mother   . Breast cancer Mother   .  Diabetes Maternal Grandmother   . Heart failure Maternal Grandmother   . Ovarian cancer Maternal Grandmother   . Diabetes Maternal Grandfather   . Diabetes Father   . Breast cancer Maternal Aunt     The following portions of the patient's history were reviewed and updated as appropriate: allergies, current medications, past family history, past medical history, past social history, past surgical history and problem list.  Review of Systems Pertinent items noted in HPI and remainder of comprehensive ROS otherwise negative.  Physical Exam:  There were no vitals taken for this visit. CONSTITUTIONAL: Well-developed, well-nourished female in no acute distress.  HENT:  Normocephalic, atraumatic, External right and left ear normal. Oropharynx is clear and moist EYES: Conjunctivae and EOM are normal. Pupils are equal, round, and reactive to light. No scleral icterus.  NECK: Normal range of motion, supple, no masses.  Normal thyroid.  SKIN: Skin is warm and dry. No rash noted. Not diaphoretic. No erythema. No pallor. MUSCULOSKELETAL: Normal range of motion. No tenderness.  No cyanosis, clubbing, or edema.  2+ distal pulses. NEUROLOGIC: Alert and oriented to person, place, and time. Normal reflexes, muscle tone coordination.  PSYCHIATRIC: Normal mood and affect. Normal behavior. Normal judgment and thought content. CARDIOVASCULAR: Normal heart rate noted, regular rhythm RESPIRATORY: Clear to auscultation bilaterally. Effort and breath  sounds normal, no problems with respiration noted. BREASTS: Symmetric in size. No masses, tenderness, skin changes, nipple drainage, or lymphadenopathy bilaterally. Performed in the presence of a chaperone. ABDOMEN: Soft, no distention noted.  No tenderness, rebound or guarding.  PELVIC: Normal appearing external genitalia and urethral meatus; normal appearing vaginal mucosa and cervix.  No abnormal discharge noted.  Pap smear obtained.  Normal uterine size, no other  palpable masses, no uterine or adnexal tenderness.  Performed in the presence of a chaperone.   Assessment and Plan:    There are no diagnoses linked to this encounter. Pap smear not indicated Mammogram not indicated Lab: none has done through work  Refill: none  Referrals: none Discussed use of ovulation kt if not conceiving, follow up 3-6 months if unable to get pregnant to discuss use of clomid.  Routine preventative health maintenance measures emphasized. Please refer to After Visit Summary for other counseling recommendations.      Doreene Burke, CNM

## 2019-10-18 ENCOUNTER — Other Ambulatory Visit: Payer: Self-pay

## 2019-10-18 ENCOUNTER — Ambulatory Visit (INDEPENDENT_AMBULATORY_CARE_PROVIDER_SITE_OTHER): Payer: Managed Care, Other (non HMO) | Admitting: Otolaryngology

## 2019-10-18 ENCOUNTER — Encounter (INDEPENDENT_AMBULATORY_CARE_PROVIDER_SITE_OTHER): Payer: Self-pay | Admitting: Otolaryngology

## 2019-10-18 VITALS — Temp 98.2°F

## 2019-10-18 DIAGNOSIS — J310 Chronic rhinitis: Secondary | ICD-10-CM

## 2019-10-18 NOTE — Progress Notes (Signed)
HPI: Anita Hurst is a 24 y.o. female who presents is referred by [facility] urgent care for evaluation of dizziness.  Symptoms apparently initially started with a right earache after she went swimming and [place].  She has had history of swimmers ear in the past.  She was treated with antibiotic eardrops as well as Augmentin 875 mg twice daily for 10 days.  About 2 weeks ago while she was driving she turned her head quickly and heard a pop in her right ear and had some dizziness that has felt persistent.  She does not really describe vertigo but more of a nonspecific dizziness.  She feels like she is dizzy in the office today although she has no spinning sensation no nystagmus.  She also feels like she might have water in the left ear and some pain and discomfort in the right ear.  She does have history of allergies and has taken azelastine but is not using it presently.  She tried Flonase years ago but apparently had a nosebleed associated with the Flonase.Marland Kitchen  Past Medical History:  Diagnosis Date  . GERD (gastroesophageal reflux disease)   . Pyelonephritis affecting pregnancy in first trimester, antepartum    Past Surgical History:  Procedure Laterality Date  . CESAREAN SECTION    . NO PAST SURGERIES     Social History   Socioeconomic History  . Marital status: Married    Spouse name: Not on file  . Number of children: Not on file  . Years of education: Not on file  . Highest education level: Not on file  Occupational History  . Not on file  Tobacco Use  . Smoking status: Never Smoker  . Smokeless tobacco: Never Used  Substance and Sexual Activity  . Alcohol use: Yes  . Drug use: No  . Sexual activity: Yes    Birth control/protection: None  Other Topics Concern  . Not on file  Social History Narrative  . Not on file   Social Determinants of Health   Financial Resource Strain:   . Difficulty of Paying Living Expenses:   Food Insecurity:    . Worried About Programme researcher, broadcasting/film/video in the Last Year:   . Barista in the Last Year:   Transportation Needs:   . Freight forwarder (Medical):   Marland Kitchen Lack of Transportation (Non-Medical):   Physical Activity:   . Days of Exercise per Week:   . Minutes of Exercise per Session:   Stress:   . Feeling of Stress :   Social Connections:   . Frequency of Communication with Friends and Family:   . Frequency of Social Gatherings with Friends and Family:   . Attends Religious Services:   . Active Member of Clubs or Organizations:   . Attends Banker Meetings:   Marland Kitchen Marital Status:    Family History  Problem Relation Age of Onset  . Cancer Mother        Breast  . Diabetes Mother   . Breast cancer Mother   . Diabetes Maternal Grandmother   . Heart failure Maternal Grandmother   . Ovarian cancer Maternal Grandmother   . Diabetes Maternal Grandfather   . Diabetes Father   . Heart attack Father   . Breast cancer Maternal Aunt    Allergies  Allergen Reactions  . Bee Venom Hives and Rash  . Sulfa Antibiotics Hives and Rash   Prior to Admission medications  Medication Sig Start Date End Date Taking? Authorizing Provider  busPIRone (BUSPAR) 7.5 MG tablet Take 7.5 mg by mouth daily.   Yes [provider]  PARAGARD INTRAUTERINE COPPER IU by Intrauterine route.   Yes [provider]  ranitidine (ZANTAC) 150 MG tablet Take by mouth. 11/08/15 11/07/16  [provider]     Positive ROS: Otherwise negative  All other systems have been reviewed and were otherwise negative with the exception of those mentioned in the HPI and as above.  Physical Exam: Constitutional: Alert, well-appearing, no acute distress Ears: External ears without lesions or tenderness.  Ear canals are clear bilaterally with no evidence of external otitis.  TMs are clear with good mobility on pneumatic otoscopy.  On hearing screening with the 512 and 1024 tuning fork she hears  well in both ears with AC > BC bilaterally.  Dix-Hallpike testing revealed no evidence of BPPV. Nasal: External nose without lesions. Septum midline with moderate rhinitis.  Both the middle meatus regions were clear with no clinical signs of infection with only clear mucus discharge..  Oral: Lips and gums without lesions. Tongue and palate mucosa without lesions. Posterior oropharynx clear. Neck: No palpable adenopathy or masses Respiratory: Breathing comfortably  Skin: No facial/neck lesions or rash noted.  Procedures  Assessment: Presently no signs of persistent external otitis.  Both TMs are clear. Chronic rhinitis with questionable eustachian tube dysfunction. Dizziness questionable etiology may be related to "sinus pressure".  Plan: Recommended use of Nasacort 2 sprays each nostril at night as this will help not only with sinus pressure as well as eustachian tube function. Discussed with her concerning using alcohol vinegar ear rinses when she gets water in her ears to help prevent swimmer's ear in the future.   Radene Journey, MD   CC:

## 2019-12-20 ENCOUNTER — Encounter: Payer: Self-pay | Admitting: Certified Nurse Midwife

## 2019-12-20 ENCOUNTER — Ambulatory Visit: Payer: Managed Care, Other (non HMO) | Admitting: Certified Nurse Midwife

## 2019-12-20 ENCOUNTER — Other Ambulatory Visit: Payer: Self-pay

## 2019-12-20 VITALS — BP 117/82 | HR 80 | Ht 66.0 in | Wt 181.4 lb

## 2019-12-20 DIAGNOSIS — N926 Irregular menstruation, unspecified: Secondary | ICD-10-CM | POA: Diagnosis not present

## 2019-12-20 DIAGNOSIS — R42 Dizziness and giddiness: Secondary | ICD-10-CM | POA: Diagnosis not present

## 2019-12-20 NOTE — Progress Notes (Addendum)
GYN ENCOUNTER NOTE  Subjective:       Anita Hurst is a 24 y.o. G72P0101 female is here for gynecologic evaluation of the following issues:  1. Irregular period, trying to get pregnant. Her para guard  IUD was removed in march. She had 2 normal periods. She had no period in June or July and has only had light spotting for 2 days august. She has also had dizziness that she has been treated for but has not had any blood work for .   Gynecologic History Patient's last menstrual period was 09/28/2019 (exact date). Contraception: none Last Pap: 03/10/19. Results were: normal Last mammogram: n/a   Obstetric History OB History  Gravida Para Term Preterm AB Living  _0 SAB TAB Ectopic Multiple Live Births          1    # Outcome Date GA Lbr Len/2nd Weight Sex Delivery Anes PTL Lv  1 Preterm 11/05/15 [redacted]w[redacted]d  M CS-LTranv  Y LIV    Past Medical History:  Diagnosis Date   GERD (gastroesophageal reflux disease)    Pyelonephritis affecting pregnancy in first trimester, antepartum     Past Surgical History:  Procedure Laterality Date   CESAREAN SECTION     NO PAST SURGERIES      Current Outpatient Medications on File Prior to Visit  Medication Sig Dispense Refill   busPIRone (BUSPAR) 7.5 MG tablet Take 7.5 mg by mouth daily.     meclizine (ANTIVERT) 25 MG tablet Take 25 mg by mouth 3 (three) times daily as needed.     ranitidine (ZANTAC) 150 MG tablet Take by mouth.     No current facility-administered medications on file prior to visit.    Allergies  Allergen Reactions   Bee Venom Hives and Rash   Sulfa Antibiotics Hives and Rash    Social History   Socioeconomic History   Marital status: Married    Spouse name: Not on file   Number of children: Not on file   Years of education: Not on file   Highest education level: Not on file  Occupational History   Not on file  Tobacco Use   Smoking status: Never Smoker   Smokeless tobacco: Never  Used  Substance and Sexual Activity   Alcohol use: Yes   Drug use: No   Sexual activity: Yes    Birth control/protection: None  Other Topics Concern   Not on file  Social History Narrative   Not on file   Social Determinants of Health   Financial Resource Strain:    Difficulty of Paying Living Expenses:   Food Insecurity:    Worried About RCharity fundraiserin the Last Year:    RArboriculturistin the Last Year:   Transportation Needs:    LFilm/video editor(Medical):    Lack of Transportation (Non-Medical):   Physical Activity:    Days of Exercise per Week:    Minutes of Exercise per Session:   Stress:    Feeling of Stress :   Social Connections:    Frequency of Communication with Friends and Family:    Frequency of Social Gatherings with Friends and Family:    Attends Religious Services:    Active Member of Clubs or Organizations:    Attends CArchivistMeetings:    Marital Status:   Intimate Partner Violence:    Fear of Current or Ex-Partner:  Emotionally Abused:    Physically Abused:    Sexually Abused:     Family History  Problem Relation Age of Onset   Cancer Mother        Breast   Diabetes Mother    Breast cancer Mother    Diabetes Maternal Grandmother    Heart failure Maternal Grandmother    Ovarian cancer Maternal Grandmother    Diabetes Maternal Grandfather    Diabetes Father    Heart attack Father    Breast cancer Maternal Aunt     The following portions of the patient's history were reviewed and updated as appropriate: allergies, current medications, past family history, past medical history, past social history, past surgical history and problem list.  Review of Systems Review of Systems - Negative except as mentioend in hpi Review of Systems - General ROS: negative for - chills, fatigue, fever, hot flashes, malaise or night sweats Hematological and Lymphatic ROS: negative for - bleeding problems  or swollen lymph nodes Gastrointestinal ROS: negative for - abdominal pain, blood in stools, change in bowel habits and nausea/vomiting Musculoskeletal ROS: negative for - joint pain, muscle pain or muscular weakness Genito-Urinary ROS: negative for - change in menstrual cycle, dysmenorrhea, dyspareunia, dysuria, genital discharge, genital ulcers, hematuria, incontinence,heavy menses, nocturia or pelvic pain. Positive for irregular period   Objective:   BP 117/82    Pulse 80    Ht _0  (1.676 m)    Wt 181 lb 7 oz (82.3 kg)    LMP 09/28/2019 (Exact Date) Comment: spotted 12/16/19-12/18/19   BMI 29.28 kg/m  CONSTITUTIONAL: Well-developed, well-nourished female in no acute distress.  HENT:  Normocephalic, atraumatic.  NECK: Normal range of motion, supple, no masses.  Normal thyroid.  SKIN: Skin is warm and dry. No rash noted. Not diaphoretic. No erythema. No pallor. [place]: Alert and oriented to person, place, and time. PSYCHIATRIC: Normal mood and affect. Normal behavior. Normal judgment and thought content. CARDIOVASCULAR:Not Examined RESPIRATORY: Not Examined BREASTS: Not Examined ABDOMEN: Soft, non distended; Non tender.  No Organomegaly. PELVIC:: deferred MUSCULOSKELETAL: Normal range of motion. No tenderness.  No cyanosis, clubbing, or edema.     Assessment:   1. Irregular periods - FSH/LH - Prolactin - Testosterone - TSH - US PELVIC COMPLETE WITH TRANSVAGINAL; Future     Plan:   Will follow up with lab results and U/s results. Discussed use of birth control and or progesterone to induce period. Use of clomid to induce ovulation. She request to avoid hormones and use of medications if possible. Encouraged use of ovulation kit. She said she use it in June and it did not show ovulation. Encouraged her to use this month since she has spotting x 2 days.  She agrees to plan. Labs collected today for PCOS and for anemia . Follow up for u/s prn.   Philip Aspen, CNM Face to  face time 15 min.

## 2019-12-20 NOTE — Addendum Note (Signed)
Addended by: Mechele Claude on: 12/20/2019 03:20 PM   Modules accepted: Orders

## 2019-12-20 NOTE — Patient Instructions (Signed)
Abnormal Uterine Bleeding °Abnormal uterine bleeding means bleeding more than usual from your uterus. It can include: °· Bleeding between periods. °· Bleeding after sex. °· Bleeding that is heavier than normal. °· Periods that last longer than usual. °· Bleeding after you have stopped having your period (menopause). °There are many problems that may cause this. You should see a doctor for any kind of bleeding that is not normal. Treatment depends on the cause of the bleeding. °Follow these instructions at home: °· Watch your condition for any changes. °· Do not use tampons, douche, or have sex, if your doctor tells you not to. °· Change your pads often. °· Get regular well-woman exams. Make sure they include a pelvic exam and cervical cancer screening. °· Keep all follow-up visits as told by your doctor. This is important. °Contact a doctor if: °· The bleeding lasts more than one week. °· You feel dizzy at times. °· You feel like you are going to throw up (nauseous). °· You throw up. °Get help right away if: °· You pass out. °· You have to change pads every hour. °· You have belly (abdominal) pain. °· You have a fever. °· You get sweaty. °· You get weak. °· You passing large blood clots from your vagina. °Summary °· Abnormal uterine bleeding means bleeding more than usual from your uterus. °· There are many problems that may cause this. You should see a doctor for any kind of bleeding that is not normal. °· Treatment depends on the cause of the bleeding. °This information is not intended to replace advice given to you by your health care provider. Make sure you discuss any questions you have with your health care provider. °Document Revised: 04/16/2016 Document Reviewed: 04/16/2016 °Elsevier Patient Education © 2020 [employer] ° °

## 2019-12-21 LAB — CBC
Hematocrit: 41.8 % (ref 34.0–46.6)
Hemoglobin: 14.6 g/dL (ref 11.1–15.9)
MCH: 30.1 pg (ref 26.6–33.0)
MCHC: 34.9 g/dL (ref 31.5–35.7)
MCV: 86 fL (ref 79–97)
Platelets: 358 10*3/uL (ref 150–450)
RBC: 4.85 x10E6/uL (ref 3.77–5.28)
RDW: 12.1 % (ref 11.7–15.4)
WBC: 10.3 10*3/uL (ref 3.4–10.8)

## 2019-12-21 LAB — TESTOSTERONE: Testosterone: 32 ng/dL (ref 13–71)

## 2019-12-21 LAB — PROLACTIN: Prolactin: 8.7 ng/mL (ref 4.8–23.3)

## 2019-12-21 LAB — FSH/LH
FSH: 5.5 m[IU]/mL
LH: 9.9 m[IU]/mL

## 2019-12-21 LAB — TSH: TSH: 1.11 u[IU]/mL (ref 0.450–4.500)

## 2019-12-21 LAB — IRON: Iron: 63 ug/dL (ref 27–159)

## 2019-12-28 ENCOUNTER — Other Ambulatory Visit: Payer: Self-pay

## 2019-12-28 ENCOUNTER — Ambulatory Visit (INDEPENDENT_AMBULATORY_CARE_PROVIDER_SITE_OTHER): Payer: Managed Care, Other (non HMO)

## 2019-12-28 DIAGNOSIS — N926 Irregular menstruation, unspecified: Secondary | ICD-10-CM

## 2020-02-15 ENCOUNTER — Other Ambulatory Visit: Payer: Self-pay

## 2020-02-15 ENCOUNTER — Ambulatory Visit: Payer: Managed Care, Other (non HMO) | Admitting: Certified Nurse Midwife

## 2020-02-15 ENCOUNTER — Encounter: Payer: Self-pay | Admitting: Certified Nurse Midwife

## 2020-02-15 VITALS — BP 114/81 | HR 102 | Ht 66.0 in | Wt 186.2 lb

## 2020-02-15 DIAGNOSIS — N926 Irregular menstruation, unspecified: Secondary | ICD-10-CM

## 2020-02-15 DIAGNOSIS — O09299 Supervision of pregnancy with other poor reproductive or obstetric history, unspecified trimester: Secondary | ICD-10-CM

## 2020-02-15 DIAGNOSIS — Z8751 Personal history of pre-term labor: Secondary | ICD-10-CM

## 2020-02-15 DIAGNOSIS — Z98891 History of uterine scar from previous surgery: Secondary | ICD-10-CM | POA: Diagnosis not present

## 2020-02-15 NOTE — Patient Instructions (Signed)

## 2020-02-15 NOTE — Progress Notes (Signed)
Subjective:    Anita Hurst is a 24 y.o. female who presents for evaluation of amenorrhea. She believes she could be pregnant. Pregnancy is desired. Sexual Activity: single partner, contraception: none. Current symptoms also include: nausea. Last period was abnormal-irregular periods.   Patient's last menstrual period was 01/15/2020 (exact date). The following portions of the patient's history were reviewed and updated as appropriate: allergies, current medications, past family history, past medical history, past social history, past surgical history and problem list.  Review of Systems Pertinent items are noted in HPI.     Objective:    BP 114/81   Pulse (!) 102   Ht 5\' 6"  (1.676 m)   Wt 186 lb 3 oz (84.5 kg)   LMP 01/15/2020 (Exact Date)   BMI 30.05 kg/m  General: alert, cooperative, appears stated age and no acute distress    Lab Review Urine HCG: positive  faint positive Beta quant ordered   Assessment:    Absence of menstruation.     Plan:  Positive: EDC: 10/21/2020 Briefly discussed pre-natal care options. Discussed midwifery or MD care. Plans to be midwife pt  Encouraged well-balanced diet, plenty of rest when needed, pre-natal vitamins daily and walking for exercise. Discussed self-help for nausea, avoiding OTC medications until consulting provider or pharmacist, other than Tylenol as needed, minimal caffeine (1-2 cups daily) and avoiding alcohol. She will schedule dating u/s in 3 wks, her nurse visit @ 10 wk and her initial OB visit@ 12 wks. . Feel free to call with any questions.   10/23/2020, CNM

## 2020-02-16 ENCOUNTER — Other Ambulatory Visit: Payer: Self-pay | Admitting: Certified Nurse Midwife

## 2020-02-16 DIAGNOSIS — N912 Amenorrhea, unspecified: Secondary | ICD-10-CM

## 2020-02-16 LAB — BETA HCG QUANT (REF LAB): hCG Quant: 161 m[IU]/mL

## 2020-02-22 ENCOUNTER — Other Ambulatory Visit: Payer: Managed Care, Other (non HMO)

## 2020-02-22 ENCOUNTER — Other Ambulatory Visit: Payer: Self-pay

## 2020-02-22 DIAGNOSIS — N912 Amenorrhea, unspecified: Secondary | ICD-10-CM

## 2020-02-23 LAB — BETA HCG QUANT (REF LAB): hCG Quant: 2251 m[IU]/mL

## 2020-03-07 ENCOUNTER — Ambulatory Visit (INDEPENDENT_AMBULATORY_CARE_PROVIDER_SITE_OTHER): Payer: Managed Care, Other (non HMO)

## 2020-03-07 ENCOUNTER — Other Ambulatory Visit: Payer: Self-pay

## 2020-03-07 ENCOUNTER — Other Ambulatory Visit: Payer: Managed Care, Other (non HMO)

## 2020-03-07 DIAGNOSIS — N926 Irregular menstruation, unspecified: Secondary | ICD-10-CM

## 2020-03-07 DIAGNOSIS — Z3A01 Less than 8 weeks gestation of pregnancy: Secondary | ICD-10-CM

## 2020-03-27 ENCOUNTER — Ambulatory Visit (INDEPENDENT_AMBULATORY_CARE_PROVIDER_SITE_OTHER): Payer: Managed Care, Other (non HMO) | Admitting: Surgical

## 2020-03-27 ENCOUNTER — Other Ambulatory Visit: Payer: Self-pay

## 2020-03-27 VITALS — BP 121/86 | HR 104 | Ht 66.0 in | Wt 190.0 lb

## 2020-03-27 DIAGNOSIS — Z3491 Encounter for supervision of normal pregnancy, unspecified, first trimester: Secondary | ICD-10-CM

## 2020-03-27 NOTE — Progress Notes (Signed)
Anita Hurst presents for NOB nurse interview visit. Pregnancy confirmation done 02/15/2020. G2. P0101. Pregnancy education material explained and given. 0 cats in home. NOB labs ordered. TSH/HbgA1c ordered due to BMI 30 or greater. Sickle cell ordered due to patient's race. HIV labs and drug screen were explained and ordered. PNV encouraged. Genetic screening options discussed. Genetic testing:Declined. Patient may discuss with the provider. Patient to follow up with provider on 04/11/2020 week for NOB physical. All questions answered. Patient stated that she is unsure when she is supposed to start taking her ASA. Patient is going to send a my chart message with question.

## 2020-03-28 LAB — RPR: RPR Ser Ql: NONREACTIVE

## 2020-03-28 LAB — URINALYSIS, ROUTINE W REFLEX MICROSCOPIC
Bilirubin, UA: NEGATIVE
Glucose, UA: NEGATIVE
Ketones, UA: NEGATIVE
Leukocytes,UA: NEGATIVE
Nitrite, UA: NEGATIVE
Protein,UA: NEGATIVE
RBC, UA: NEGATIVE
Specific Gravity, UA: 1.011 (ref 1.005–1.030)
Urobilinogen, Ur: 0.2 mg/dL (ref 0.2–1.0)
pH, UA: 6.5 (ref 5.0–7.5)

## 2020-03-28 LAB — HEMOGLOBIN A1C
Est. average glucose Bld gHb Est-mCnc: 105 mg/dL
Hgb A1c MFr Bld: 5.3 % (ref 4.8–5.6)

## 2020-03-28 LAB — RUBELLA SCREEN: Rubella Antibodies, IGG: 16.5 index (ref >0.99)

## 2020-03-28 LAB — TSH: TSH: 0.376 u[IU]/mL — ABNORMAL LOW (ref 0.450–4.500)

## 2020-03-28 LAB — ANTIBODY SCREEN: Antibody Screen: NEGATIVE

## 2020-03-28 LAB — HEPATITIS B SURFACE ANTIGEN: Hepatitis B Surface Ag: NEGATIVE

## 2020-03-28 LAB — ABO AND RH: Rh Factor: POSITIVE

## 2020-03-28 LAB — VARICELLA ZOSTER ANTIBODY, IGG: Varicella zoster IgG: 1189 index (ref >165)

## 2020-03-28 LAB — HIV ANTIBODY (ROUTINE TESTING W REFLEX): HIV Screen 4th Generation wRfx: NONREACTIVE

## 2020-03-29 LAB — GC/CHLAMYDIA PROBE AMP
Chlamydia trachomatis, NAA: NEGATIVE
Neisseria Gonorrhoeae by PCR: NEGATIVE

## 2020-03-29 LAB — URINE CULTURE, OB REFLEX

## 2020-03-29 LAB — CULTURE, OB URINE

## 2020-03-31 LAB — MONITOR DRUG PROFILE 14(MW)
Amphetamine Scrn, Ur: NEGATIVE ng/mL
BARBITURATE SCREEN URINE: NEGATIVE ng/mL
BENZODIAZEPINE SCREEN, URINE: NEGATIVE ng/mL
Buprenorphine, Urine: NEGATIVE ng/mL
CANNABINOIDS UR QL SCN: NEGATIVE ng/mL
Cocaine (Metab) Scrn, Ur: NEGATIVE ng/mL
Creatinine(Crt), U: 53.7 mg/dL (ref 20.0–300.0)
Fentanyl, Urine: NEGATIVE pg/mL
Meperidine Screen, Urine: NEGATIVE ng/mL
Methadone Screen, Urine: NEGATIVE ng/mL
OXYCODONE+OXYMORPHONE UR QL SCN: NEGATIVE ng/mL
Opiate Scrn, Ur: NEGATIVE ng/mL
Ph of Urine: 6.4 (ref 4.5–8.9)
Phencyclidine Qn, Ur: NEGATIVE ng/mL
Propoxyphene Scrn, Ur: NEGATIVE ng/mL
SPECIFIC GRAVITY: 1.009
Tramadol Screen, Urine: NEGATIVE ng/mL

## 2020-04-11 ENCOUNTER — Encounter: Payer: Self-pay | Admitting: Certified Nurse Midwife

## 2020-04-11 ENCOUNTER — Ambulatory Visit (INDEPENDENT_AMBULATORY_CARE_PROVIDER_SITE_OTHER): Payer: Managed Care, Other (non HMO) | Admitting: Certified Nurse Midwife

## 2020-04-11 ENCOUNTER — Other Ambulatory Visit: Payer: Self-pay

## 2020-04-11 VITALS — BP 115/74 | HR 93 | Wt 191.8 lb

## 2020-04-11 DIAGNOSIS — Z3481 Encounter for supervision of other normal pregnancy, first trimester: Secondary | ICD-10-CM

## 2020-04-11 DIAGNOSIS — Z3A12 12 weeks gestation of pregnancy: Secondary | ICD-10-CM | POA: Diagnosis not present

## 2020-04-11 LAB — POCT URINALYSIS DIPSTICK OB
Bilirubin, UA: NEGATIVE
Blood, UA: NEGATIVE
Glucose, UA: NEGATIVE
Ketones, UA: NEGATIVE
Leukocytes, UA: NEGATIVE
Nitrite, UA: NEGATIVE
POC,PROTEIN,UA: NEGATIVE
Spec Grav, UA: 1.01
Urobilinogen, UA: 0.2 U/dL
pH, UA: 7

## 2020-04-11 MED ORDER — ASPIRIN EC 81 MG PO TBEC: 81.0000 mg | DELAYED_RELEASE_TABLET | Freq: Every day | ORAL | 11 refills | Status: DC

## 2020-04-11 NOTE — Progress Notes (Signed)
NEW OB HISTORY AND PHYSICAL  SUBJECTIVE:       Anita Hurst is a 24 y.o. G2P0101 female, Patient's last menstrual period was 01/15/2020 (exact date)., Estimated Date of Delivery: 10/21/20, [redacted]w[redacted]d, presents today for establishment of Prenatal Care. 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 Postpartum bladder infection - had to keep foley in at home x 1 wk.     Gynecologic History Patient's last menstrual period was 01/15/2020 (exact date). Normal Contraception: none Last Pap: 03/10/2019 Results were: normal  Obstetric History OB History  Gravida Para Term Preterm AB Living  2 1   1   1   SAB TAB Ectopic Multiple Live Births          1    # Outcome Date GA Lbr Len/2nd Weight Sex Delivery Anes PTL Lv  2 Current           1 Preterm 11/05/15 [redacted]w[redacted]d   M CS-LTranv  Y LIV    Past Medical History:  Diagnosis Date  . GERD (gastroesophageal reflux disease)   . Pyelonephritis affecting pregnancy in first trimester, antepartum     Past Surgical History:  Procedure Laterality Date  . CESAREAN SECTION    . NO PAST SURGERIES      Current Outpatient Medications on File Prior to Visit  Medication Sig Dispense Refill  . Prenatal Vit-Fe Fumarate-FA (MULTIVITAMIN-PRENATAL) 27-0.8 MG TABS tablet Take 1 tablet by mouth daily at 12 noon.    . busPIRone (BUSPAR) 7.5 MG tablet Take 7.5 mg by mouth daily. (Patient not taking: Reported on 03/27/2020)     No current facility-administered medications on file prior to visit.    Allergies  Allergen Reactions  . Bee Venom Hives and Rash  . Sulfa Antibiotics Hives and Rash    Social History   Socioeconomic History  . Marital status: Married    Spouse name: Not on file  . Number of children: Not on file  . Years of education: Not on file  . Highest education level: Not on file  Occupational History  . Not on file  Tobacco Use  . Smoking status: Never Smoker  . Smokeless  tobacco: Never Used  Vaping Use  . Vaping Use: Never used  Substance and Sexual Activity  . Alcohol use: Not Currently  . Drug use: No  . Sexual activity: Yes    Birth control/protection: None  Other Topics Concern  . Not on file  Social History Narrative  . Not on file   Social Determinants of Health   Financial Resource Strain:   . Difficulty of Paying Living Expenses: Not on file  Food Insecurity:   . Worried About 03/29/2020 in the Last Year: Not on file  . Ran Out of Food in the Last Year: Not on file  Transportation Needs:   . Lack of Transportation (Medical): Not on file  . Lack of Transportation (Non-Medical): Not on file  Physical Activity:   . Days of Exercise per Week: Not on file  . Minutes of Exercise per Session: Not on file  Stress:   . Feeling of Stress : Not on file  Social Connections:   . Frequency of Communication with Friends and Family: Not on file  . Frequency of Social Gatherings with Friends and Family: Not on file  . Attends Religious Services: Not on file  . Active Member of Clubs or Organizations: Not on file  .  Attends Banker Meetings: Not on file  . Marital Status: Not on file  Intimate Partner Violence:   . Fear of Current or Ex-Partner: Not on file  . Emotionally Abused: Not on file  . Physically Abused: Not on file  . Sexually Abused: Not on file    Family History  Problem Relation Age of Onset  . Cancer Mother        Breast  . Diabetes Mother   . Breast cancer Mother   . Diabetes Maternal Grandmother   . Heart failure Maternal Grandmother   . Ovarian cancer Maternal Grandmother   . Diabetes Maternal Grandfather   . Diabetes Father   . Heart attack Father   . Cancer Father   . Breast cancer Maternal Aunt     The following portions of the patient's history were reviewed and updated as appropriate: allergies, current medications, past OB history, past medical history, past surgical history, past family  history, past social history, and problem list.    OBJECTIVE: Initial Physical Exam (New OB)  GENERAL APPEARANCE: alert, well appearing, in no apparent distress, oriented to person, place and time, overweight HEAD: normocephalic, atraumatic MOUTH: mucous membranes moist, pharynx normal without lesions THYROID: no thyromegaly or masses present BREASTS: no masses noted, no significant tenderness, no palpable axillary nodes, no skin changes LUNGS: clear to auscultation, no wheezes, rales or rhonchi, symmetric air entry HEART: regular rate and rhythm, no murmurs ABDOMEN: soft, nontender, nondistended, no abnormal masses, no epigastric pain, obese and FHT present EXTREMITIES: no redness or tenderness in the calves or thighs, no edema, no limitation in range of motion SKIN: normal coloration and turgor, no rashes LYMPH NODES: no adenopathy palpable NEUROLOGIC: alert, oriented, normal speech, no focal findings or movement disorder noted  PELVIC EXAM EXTERNAL GENITALIA: normal appearing vulva with no masses, tenderness or lesions  ASSESSMENT: Normal pregnancy  PLAN: New OB counseling: The patient has been given an overview regarding routine prenatal care. Recommendations regarding diet, weight gain, and exercise in pregnancy were given. Prenatal testing, optional genetic testing, carrier screening, and ultrasound use in pregnancy were reviewed. Discussed starting asa at 14 wks.  Benefits of Breast Feeding were discussed. The patient is encouraged to consider nursing her baby post partum.  Doreene Burke, CNM   Doreene Burke, CNM

## 2020-04-11 NOTE — Patient Instructions (Signed)

## 2020-04-11 NOTE — Progress Notes (Signed)
Pt present for NOB physical. No complaints

## 2020-04-12 LAB — COMPREHENSIVE METABOLIC PANEL
ALT: 9 IU/L (ref 0–32)
AST: 8 IU/L (ref 0–40)
Albumin/Globulin Ratio: 1.6 (ref 1.2–2.2)
Albumin: 4 g/dL (ref 3.9–5.0)
Alkaline Phosphatase: 50 IU/L (ref 44–121)
BUN/Creatinine Ratio: 13 (ref 9–23)
BUN: 7 mg/dL (ref 6–20)
Bilirubin Total: <0.2 mg/dL (ref 0.0–1.2)
CO2: 24 mmol/L (ref 20–29)
Calcium: 9.3 mg/dL (ref 8.7–10.2)
Chloride: 102 mmol/L (ref 96–106)
Creatinine, Ser: 0.55 mg/dL — ABNORMAL LOW (ref 0.57–1.00)
GFR calc Af Amer: 153 mL/min/{1.73_m2} (ref >59)
GFR calc non Af Amer: 133 mL/min/{1.73_m2} (ref >59)
Globulin, Total: 2.5 g/dL (ref 1.5–4.5)
Glucose: 109 mg/dL — ABNORMAL HIGH (ref 65–99)
Potassium: 4.1 mmol/L (ref 3.5–5.2)
Sodium: 138 mmol/L (ref 134–144)
Total Protein: 6.5 g/dL (ref 6.0–8.5)

## 2020-04-12 LAB — CBC
Hematocrit: 36.1 % (ref 34.0–46.6)
Hemoglobin: 12.3 g/dL (ref 11.1–15.9)
MCH: 29.4 pg (ref 26.6–33.0)
MCHC: 34.1 g/dL (ref 31.5–35.7)
MCV: 86 fL (ref 79–97)
Platelets: 318 10*3/uL (ref 150–450)
RBC: 4.19 x10E6/uL (ref 3.77–5.28)
RDW: 12.2 % (ref 11.7–15.4)
WBC: 11.1 10*3/uL — ABNORMAL HIGH (ref 3.4–10.8)

## 2020-04-12 LAB — PROTEIN / CREATININE RATIO, URINE
Creatinine, Urine: 164.9 mg/dL
Protein, Ur: 7.1 mg/dL
Protein/Creat Ratio: 43 mg/g creat (ref 0–200)

## 2020-05-06 NOTE — L&D Delivery Note (Signed)
       Delivery Note   Anita Hurst is a 25 y.o. G2P0101 at 104w4d Estimated Date of Delivery: 10/21/20  PRE-OPERATIVE DIAGNOSIS:  1) [redacted]w[redacted]d pregnancy. Trial of labor after cesarean section   POST-OPERATIVE DIAGNOSIS:  1) [redacted]w[redacted]d pregnancy s/p VBAC, Spontaneous    Delivery Type: VBAC, Spontaneous    Delivery Anesthesia: Epidural   Labor Complications:  none    ESTIMATED BLOOD LOSS: 200 ml    FINDINGS:   1) female infant, Apgar scores of 8   at 1 minute and 9   at 5 minutes and a birthweight of   ounces.    2) Nuchal cord: no  SPECIMENS:   PLACENTA:   Appearance: Intact , 3 vessel cord   Removal: Spontaneous      Disposition:  per protocol   DISPOSITION:  Infant to left in stable condition in the delivery room, with L&D personnel and mother,  NARRATIVE SUMMARY: Labor course:  Ms. Anita Hurst is a [id number] at [redacted]w[redacted]d who presented for labor management.  She progressed well in labor with pitocin.  She received the appropriate epidural anesthesia and proceeded to complete dilation. She evidenced good maternal expulsive effort during the second stage. She went on to deliver a viable female infant.The placenta delivered without problems and was noted to be complete. A perineal and vaginal examination was performed. Episiotomy/Lacerations: bilateral periurethral . Left side sutured 4-0 vicryl. Right side hemostatic not repaired. 1st degree perineal hemostatic not repaired. The patient tolerated this well.  Doreene Burke, CNM  10/18/2020 5:45 PM

## 2020-05-11 ENCOUNTER — Encounter: Payer: Managed Care, Other (non HMO) | Admitting: Certified Nurse Midwife

## 2020-05-25 ENCOUNTER — Ambulatory Visit (INDEPENDENT_AMBULATORY_CARE_PROVIDER_SITE_OTHER): Payer: Managed Care, Other (non HMO) | Admitting: Certified Nurse Midwife

## 2020-05-25 ENCOUNTER — Other Ambulatory Visit: Payer: Self-pay

## 2020-05-25 DIAGNOSIS — O34219 Maternal care for unspecified type scar from previous cesarean delivery: Secondary | ICD-10-CM | POA: Insufficient documentation

## 2020-05-25 DIAGNOSIS — Z3A18 18 weeks gestation of pregnancy: Secondary | ICD-10-CM

## 2020-05-25 DIAGNOSIS — Z3689 Encounter for other specified antenatal screening: Secondary | ICD-10-CM

## 2020-05-25 DIAGNOSIS — O09299 Supervision of pregnancy with other poor reproductive or obstetric history, unspecified trimester: Secondary | ICD-10-CM

## 2020-05-25 DIAGNOSIS — Z98891 History of uterine scar from previous surgery: Secondary | ICD-10-CM

## 2020-05-25 DIAGNOSIS — Z3481 Encounter for supervision of other normal pregnancy, first trimester: Secondary | ICD-10-CM

## 2020-05-25 LAB — POCT URINALYSIS DIPSTICK OB
Blood, UA: NEGATIVE
Glucose, UA: NEGATIVE
Ketones, UA: NEGATIVE
Leukocytes, UA: NEGATIVE
Nitrite, UA: NEGATIVE
POC,PROTEIN,UA: NEGATIVE
Spec Grav, UA: 1.025 (ref 1.010–1.025)
Urobilinogen, UA: 0.2 E.U./dL
pH, UA: 5 (ref 5.0–8.0)

## 2020-05-25 NOTE — Patient Instructions (Addendum)
Heartburn During Pregnancy  Heartburn is a type of pain or discomfort in the throat or chest. It may cause a burning feeling. It happens when stomach acid backs up into the part of the body that moves food from your mouth to your stomach (esophagus). This condition is also called acid reflux. Heartburn is common during pregnancy. It usually goes away or gets better after giving birth. What are the causes? This condition is caused by stomach acid that backs up into the part of the body that moves food from your mouth to your stomach. Acid can back up because of: Changing amounts of hormones in the body. Large meals. Certain foods and drinks. Exercise. More acid being made in the stomach. What increases the risk? You are more likely to develop this condition if: You had heartburn before you became pregnant. You have been pregnant more than once before. You are overweight or obese. Heartburn is also likely to happen as you get further along in your pregnancy. The risk is higher in the last 3 months before birth (third trimester). What are the signs or symptoms? Symptoms of this condition include: Burning pain in the chest or lower throat. A bitter taste in the mouth. Coughing. Problems swallowing. Vomiting. A hoarse voice. Asthma. Symptoms may get worse when you lie down or bend over. You may feel worse at night. How is this treated? Treatment for this condition depends on how bad your symptoms are. Your doctor may ask you to: Take over-the-counter medicines for mild heartburn. These medicines include antacids or acid reducers. Take prescription medicines to reduce stomach acid or to protect your stomach. Change your diet. Raise the head of your bed so it is higher than the foot of the bed. Follow these instructions at home: Eating and drinking Do not drink alcohol while you are pregnant. Learn which foods and drinks make you feel worse, and avoid them. Eat small meals often, instead  of large meals. Avoid drinking a lot of liquid with your meals. Avoid eating meals during the 2-3 hours before you go to bed. Avoid lying down right after you eat. Do not exercise right after you eat. Drinks to avoid Coffee and tea (with or without caffeine). Energy drinks and sports drinks. Carbonated drinks or sodas. Citrus fruit juices. Foods to avoid Chocolate and cocoa. Peppermint and mint flavorings. Garlic, onions, and horseradish. Spicy foods and foods that have a lot of acid in them. These include peppers, chili powder, curry powder, vinegar, hot sauces, and barbecue sauce. Citrus fruits, such as oranges, lemons, and limes. Tomato-based foods, such as red sauce, chili, and salsa. Fried and fatty foods, such as donuts, french fries, potato chips, and high-fat dressings. High-fat meats, such as hot dogs, precooked or cured meats, sausage, ham, and bacon. High-fat dairy items, such as whole milk, butter, and cheese. Medicines Take over-the-counter and prescription medicines only as told by your doctor. Do not take aspirin or NSAIDs, such as ibuprofen, unless your doctor tells you to do that. Your doctor may tell you to avoid medicines that have sodium bicarbonate in them. General instructions If told, raise the head of your bed about 6 inches (15 cm). You can do this by putting blocks under the legs. Sleeping with more pillows does not help with heartburn. Do not use any products that contain nicotine or tobacco, such as cigarettes, e-cigarettes, and chewing tobacco. If you need help quitting, ask your doctor. Wear loose-fitting clothing. Try to lower your stress, such as with   more pillows does not help with heartburn.  Do not use any products that contain nicotine or tobacco, such as cigarettes, e-cigarettes, and chewing tobacco. If you need help quitting, ask your doctor.  Wear loose-fitting clothing.  Try to lower your stress, such as with yoga or meditation. If you need help, ask your doctor.  Stay at a healthy weight. If you are overweight, work with your doctor to safely manage your weight.  Keep all follow-up visits as told by your doctor. This  is important. Where to find more information  American Pregnancy Association: americanpregnancy.org Contact a doctor if:  You get new symptoms.  Your symptoms do not get better with treatment.  You lose weight and you do not know why.  You have trouble swallowing.  You make loud sounds when you breathe (wheeze).  You have a cough that does not go away.  You have heartburn often for more than 2 weeks.  You feel like you may vomit (nausea), or you vomit, and this does not get better with treatment.  You have pain in your belly (abdomen). Get help right away if:  You have very bad chest pain that spreads to your arm, neck, or jaw.  You feel sweaty, dizzy, or light-headed.  You have trouble breathing.  You have pain when swallowing.  You vomit, and your vomit looks like blood or coffee grounds.  Your poop (stool) is bloody or black. Summary  Heartburn in pregnancy is common, especially during the last 3 months before birth.  This condition is caused by stomach acid backing up into the part of the body that moves food from the mouth to the stomach.  This condition can be treated with medicines, changes to your diet, or raising the head of your bed.  Contact a doctor if your symptoms do not go away or you get new symptoms. This information is not intended to replace advice given to you by your health care provider. Make sure you discuss any questions you have with your health care provider. Document Revised: 01/13/2019 Document Reviewed: 01/13/2019 Elsevier Patient Education  2021 Elsevier Inc.     Second Trimester of Pregnancy  The second trimester of pregnancy is from week 13 through week 27. This is also called months 4 through 6 of pregnancy. This is often the time when you feel your best. During the second trimester:  Morning sickness is less or has stopped.  You may have more energy.  You may feel hungry more often. At this time, your unborn baby (fetus)  is growing very fast. At the end of the sixth month, the unborn baby may be up to 12 inches long and weigh about 1 pounds. You will likely start to feel the baby move between 16 and 20 weeks of pregnancy. Body changes during your second trimester Your body continues to go through many changes during this time. The changes vary and generally return to normal after the baby is born. Physical changes  You will gain more weight.  You may start to get stretch marks on your hips, belly (abdomen), and breasts.  Your breasts will grow and may hurt.  Dark spots or blotches may develop on your face.  A dark line from your belly button to the pubic area (linea nigra) may appear.  You may have changes in your hair. Health changes  You may have headaches.  You may have heartburn.  You may have trouble pooping (constipation).  You may have hemorrhoids or swollen,  bulging veins (varicose veins).  Your gums may bleed.  You may pee (urinate) more often.  You may have back pain. Follow these instructions at home: Medicines  Take over-the-counter and prescription medicines only as told by your doctor. Some medicines are not safe during pregnancy.  Take a prenatal vitamin that contains at least 600 micrograms (mcg) of folic acid. Eating and drinking  Eat healthy meals that include: ? Fresh fruits and vegetables. ? Whole grains. ? Good sources of protein, such as meat, eggs, or tofu. ? Low-fat dairy products.  Avoid raw meat and unpasteurized juice, milk, and cheese.  You may need to take these actions to prevent or treat trouble pooping: ? Drink enough fluids to keep your pee (urine) pale yellow. ? Eat foods that are high in fiber. These include beans, whole grains, and fresh fruits and vegetables. ? Limit foods that are high in fat and sugar. These include fried or sweet foods. Activity  Exercise only as told by your doctor. Most people can do their usual exercise during pregnancy.  Try to exercise for 30 minutes at least 5 days a week.  Stop exercising if you have pain or cramps in your belly or lower back.  Do not exercise if it is too hot or too humid, or if you are in a place of great height (high altitude).  Avoid heavy lifting.  If you choose to, you may have sex unless your doctor tells you not to. Relieving pain and discomfort  Wear a good support bra if your breasts are sore.  Take warm water baths (sitz baths) to soothe pain or discomfort caused by hemorrhoids. Use hemorrhoid cream if your doctor approves.  Rest with your legs raised (elevated) if you have leg cramps or low back pain.  If you develop bulging veins in your legs: ? Wear support hose as told by your doctor. ? Raise your feet for 15 minutes, 3-4 times a day. ? Limit salt in your food. Safety  Wear your seat belt at all times when you are in a car.  Talk with your doctor if someone is hurting you or yelling at you a lot. Lifestyle  Do not use hot tubs, steam rooms, or saunas.  Do not douche. Do not use tampons or scented sanitary pads.  Avoid cat litter boxes and soil used by cats. These carry germs that can harm your baby and can cause a loss of your baby by miscarriage or stillbirth.  Do not use herbal medicines, illegal drugs, or medicines that are not approved by your doctor. Do not drink alcohol.  Do not smoke or use any products that contain nicotine or tobacco. If you need help quitting, ask your doctor. General instructions  Keep all follow-up visits. This is important.  Ask your doctor about local prenatal classes.  Ask your doctor about the right foods to eat or for help finding a counselor. Where to find more information  American Pregnancy Association: americanpregnancy.org  [employer] of [employer]stetricians and Gynecologists: www.acog.org  Office on [employer] [facility]: [employer] Contact a doctor if:  You have a headache that does not go away  when you take medicine.  You have changes in how you see, or you see spots in front of your eyes.  You have mild cramps, pressure, or pain in your lower belly.  You continue to feel like you may vomit (nauseous), you vomit, or you have watery poop (diarrhea).  You have bad-smelling fluid coming from your vagina.  You have pain when you pee or your pee smells bad.  You have very bad swelling of your face, hands, ankles, feet, or legs.  You have a fever. Get help right away if:  You are leaking fluid from your vagina.  You have spotting or bleeding from your vagina.  You have very bad belly cramping or pain.  You have trouble breathing.  You have chest pain.  You faint.  You have not felt your baby move for the time period told by your doctor.  You have new or increased pain, swelling, or redness in an arm or leg. Summary  The second trimester of pregnancy is from week 13 through week 27 (months 4 through 6).  Eat healthy meals.  Exercise as told by your doctor. Most people can do their usual exercise during pregnancy.  Do not use herbal medicines, illegal drugs, or medicines that are not approved by your doctor. Do not drink alcohol.  Call your doctor if you get sick or if you notice anything unusual about your pregnancy. This information is not intended to replace advice given to you by your health care provider. Make sure you discuss any questions you have with your health care provider. Document Revised: 09/29/2019 Document Reviewed: 08/05/2019 Elsevier Patient Education  2021 Elsevier Inc.   Vaginal Birth After Cesarean Delivery  Vaginal birth after cesarean delivery (VBAC) is giving birth vaginally after previously delivering a baby through a cesarean section (C-section). A VBAC may be a safe option for you, depending on your health and other factors. It is important to discuss VBAC with your health care provider early in your pregnancy so you can understand  the risks, benefits, and options. Having these discussions early will give you time to make your birth plan. Who are the best candidates for VBAC? The best candidates for VBAC are women who:  Have had one or two prior cesarean deliveries, and the incision made during the delivery was horizontal (low transverse).  Do not have a vertical (classical) scar on their uterus.  Have not had a tear in the wall of their uterus (uterine rupture).  Plan to have more pregnancies. A VBAC is also more likely to be successful:  In women who have previously given birth vaginally.  When labor starts by itself (spontaneously) before the due date. What are the benefits of VBAC? The benefits of delivering your baby vaginally instead of by a cesarean delivery include:  A shorter hospital stay.  A faster recovery time.  Less pain.  Avoiding risks associated with major surgery, such as infection and blood clots.  Less blood loss and less need for donated blood (transfusions). What are the risks of VBAC? The main risk of attempting a VBAC is that it may fail, forcing your health care provider to deliver your baby by a C-section. Other risks are rare and include:  Tearing (rupture) of the scar from a past cesarean delivery.  Other risks associated with vaginal deliveries. If a repeat cesarean delivery is needed, the risks include:  Blood loss.  Infection.  Blood clot.  Damage to surrounding organs.  Removal of the uterus (hysterectomy), if it is damaged.  Placenta problems in future pregnancies. What else should I know about my options? Delivering a baby through a VBAC is similar to having a normal spontaneous vaginal delivery. Therefore, it is safe:  To try with twins.  For your health care provider to try to turn the baby from a breech position (external cephalic  version) during labor.  With epidural analgesia for pain relief. Consider where you would like to deliver your baby. VBAC  should be attempted in facilities where an emergency cesarean delivery can be performed. VBAC is not recommended for home births. Any changes in your health or your baby's health during your pregnancy may make it necessary to change your initial decision about VBAC. Your health care provider may recommend that you do not attempt a VBAC if:  Your baby's suspected weight is 8.8 lb (4 kg) or more.  You have preeclampsia. This is a condition that causes high blood pressure along with other symptoms, such as swelling and headaches.  You will have VBAC less than 19 months after your cesarean delivery.  You are past your due date.  You need to have labor started (induced) because your cervix is not ready for labor (unfavorable). Where to find more information  American Pregnancy Association: americanpregnancy.org  [employer] of [employer]stetricians and Gynecologists: acog.org Summary  Vaginal birth after cesarean delivery (VBAC) is giving birth vaginally after previously delivering a baby through a cesarean section (C-section). A VBAC may be a safe option for you, depending on your health and other factors.  Discuss VBAC with your health care provider early in your pregnancy so you can understand the risks, benefits, options, and have plenty of time to make your birth plan.  The main risk of attempting a VBAC is that it may fail, forcing your health care provider to deliver your baby by a C-section. Other risks are rare. This information is not intended to replace advice given to you by your health care provider. Make sure you discuss any questions you have with your health care provider. Document Revised: 08/18/2018 Document Reviewed: 07/30/2016 Elsevier Patient Education  2021 [employer].  Preparing for Vaginal Birth After Cesarean Delivery Vaginal birth after cesarean delivery (VBAC) is giving birth vaginally after previously delivering a baby through a cesarean section (C-section). You  and your health are provider will discuss your options and whether you may be a good candidate for VBAC. What are my options? After a cesarean delivery, your options for future deliveries may include:  Scheduled repeat cesarean delivery. This is done in a hospital with an operating room.  Trial of labor after cesarean (TOLAC). A successful TOLAC results in a vaginal delivery. If it is not successful, you will need to have a cesarean delivery. TOLAC should be attempted in facilities where an emergency cesarean delivery can be performed. It should not be done as a home birth. Talk with your health care provider about the risks and benefits of each option early in your pregnancy. The best option for you will depend on your preferences and your overall health as well as your baby's. What should I know about my past cesarean delivery? It is important to know what type of incision was made in your uterus in a past cesarean delivery. The type of incision can affect the success of your TOLAC. Types of incisions include:  Low transverse. This is a side-to-side cut low on your uterus. The scar on your skin looks like a horizontal line just above your pubic area. This type of cut is the most common and makes you a good candidate for TOLAC.  Low vertical. This is an up-and-down cut low on your uterus. The scar on your skin looks like a vertical line between your pubic area and belly button. This type of cut puts you at higher risk for problems during  TOLAC.  High vertical or classical. This is an up-and-down cut high on your uterus. The scar on your skin looks like a vertical line that runs over the top of your belly button. This type of cut has the highest risk for problems and usually means that TOLAC is not an option.   When is VBAC not an option? As you progress through your pregnancy, circumstances may change and you may need to reconsider your options. Your situation may also change even as you begin  TOLAC. Your health care provider may not want you to attempt a VBAC if you:  Need to have labor started (induced) because your cervix is not ready for labor.  Have never had a vaginal delivery.  Have had more than two cesarean deliveries.  Are overdue.  Are pregnant with a very large baby.  Have a condition that causes high blood pressure (preeclampsia). Questions to ask your health care provider  Am I a good candidate for TOLAC?  What are my chances of a successful vaginal delivery?  Is my preferred birth location equipped for a TOLAC?  What are my pain management options during a TOLAC? Where to find more information  American Congress of Obstetricians and Gynecologists: www.acog.org  [employer]: www.midwife.org Summary  Vaginal birth after cesarean delivery (VBAC) is giving birth vaginally after previously delivering a baby through a cesarean section (C-section).  VBAC may be a safe and appropriate option for you depending on your medical history and other risk factors. Talk with your health care provider about the options available to you, and the risks and benefits of each early in your pregnancy.  TOLAC should be attempted in facilities where emergency cesarean section procedures can be performed. This information is not intended to replace advice given to you by your health care provider. Make sure you discuss any questions you have with your health care provider. Document Revised: 08/18/2018 Document Reviewed: 08/01/2016 Elsevier Patient Education  2021 [employer]

## 2020-05-25 NOTE — Progress Notes (Signed)
I have seen, interviewed, and examined the patient in conjunction with the [employer] and affirm the diagnosis and management plan.   Gunnar Bulla, CNM [facility], [facility] 05/25/20 3:12 PM

## 2020-05-25 NOTE — Progress Notes (Signed)
ROB- Reports indigestion unrelieved by home treatment measures. Discussed use of Pepcid OTC. Desire VBAC and education provided, see AVS. Anticipatory guidance regarding course of prenatal care. Reviewed red flag symptoms and when to call. RTC x 2-3 weeks for ANATOMY Ultrasound and ROB with ANNIE or sooner if needed.   Juliann Pares, Student-MidWife [employer] 05/25/20 2:21 PM

## 2020-06-05 ENCOUNTER — Telehealth: Payer: Self-pay

## 2020-06-05 NOTE — Telephone Encounter (Signed)
Called patient per CM to inform her that we will require her to pay her global maternity fee at the time of her delivery in full due to her insurance not allowing her to use her FSA until after she delivers her baby. Was unable to speak with patient LVM for patient to return my call in regards to the above.

## 2020-06-13 ENCOUNTER — Ambulatory Visit (INDEPENDENT_AMBULATORY_CARE_PROVIDER_SITE_OTHER): Payer: Managed Care, Other (non HMO)

## 2020-06-13 ENCOUNTER — Ambulatory Visit: Payer: Managed Care, Other (non HMO) | Admitting: Certified Nurse Midwife

## 2020-06-13 ENCOUNTER — Other Ambulatory Visit: Payer: Self-pay | Admitting: Certified Nurse Midwife

## 2020-06-13 ENCOUNTER — Encounter: Payer: Self-pay | Admitting: Certified Nurse Midwife

## 2020-06-13 ENCOUNTER — Other Ambulatory Visit: Payer: Self-pay

## 2020-06-13 VITALS — BP 117/88 | HR 99 | Wt 197.1 lb

## 2020-06-13 DIAGNOSIS — Z3492 Encounter for supervision of normal pregnancy, unspecified, second trimester: Secondary | ICD-10-CM

## 2020-06-13 DIAGNOSIS — Z3A21 21 weeks gestation of pregnancy: Secondary | ICD-10-CM

## 2020-06-13 LAB — POCT URINALYSIS DIPSTICK OB
Bilirubin, UA: NEGATIVE
Blood, UA: NEGATIVE
Glucose, UA: NEGATIVE
Ketones, UA: NEGATIVE
Leukocytes, UA: NEGATIVE
Nitrite, UA: NEGATIVE
POC,PROTEIN,UA: NEGATIVE
Spec Grav, UA: >=1.03 — AB (ref 1.010–1.025)
Urobilinogen, UA: 0.2 E.U./dL
pH, UA: 5 (ref 5.0–8.0)

## 2020-06-13 MED ORDER — RANITIDINE HCL 75 MG PO TABS: 75.0000 mg | ORAL_TABLET | Freq: Two times a day (BID) | ORAL | 6 refills | Status: DC

## 2020-06-13 NOTE — Patient Instructions (Signed)
Indigestion Indigestion is a feeling of pain, discomfort, burning, or fullness in the upper part of your belly (abdomen). It can come and go. It may happen often or rarely. Indigestion tends to happen while you are eating or right after you have finished eating. It may be worse:  At night.  When bending over.  While lying down. Indigestion may be a symptom of another condition. Follow these instructions at home: Eating and drinking  Follow an eating plan as told by your doctor.  You may need to avoid foods and drinks such as: ? Chocolate and cocoa. ? Peppermint and mint flavorings. ? Garlic and onions. ? Horseradish. ? Spicy and acidic foods, such as:  Peppers.  Chili powder and curry powder.  Vinegar.  Hot sauces and BBQ sauce. ? Citrus fruits, such as:  Oranges.  Lemons.  Limes. ? Tomato-based foods, such as:  Red sauce and pizza with red sauce.  Chili.  Salsa. ? Fried and fatty foods, such as:  Donuts.  French fries and potato chips.  High-fat dressings. ? High-fat meats, such as:  Hot dogs and sausage.  Rib eye steak.  Ham and bacon. ? High-fat dairy items, such as:  Whole milk.  Butter.  Cream cheese. ? Coffee and tea, with or without caffeine. ? Drinks that contain alcohol. ? Energy drinks and sports drinks. ? Carbonated drinks or sodas. ? Citrus fruit juices.  Eat small meals often. Avoid eating large meals.  Avoid drinking large amounts of liquid with your meals.  Avoid eating meals during the 2-3 hours before bedtime.  Avoid lying down right after you eat.  Avoid exercise for 2 hours after you eat.   Lifestyle  Maintain a healthy weight. Ask your doctor what weight is healthy for you. If you need to lose weight, work with your doctor.  Exercise for at least 30 minutes on 5 or more days each week, or as told by your doctor.  Avoid exercises that include bending forward. This can make your symptoms worse.  Wear loose  clothes. Do not wear anything tight around your waist.  Do not smoke or use any products that contain nicotine or tobacco. These can make your symptoms worse. If you need help quitting, ask your doctor.  Raise (elevate) the head of your bed about 6 inches (15 cm) when you sleep. You can use a wedge to do this.  Try to lower your stress. If you need help doing this, ask your doctor.      General instructions  Take over-the-counter and prescription medicines only as told by your doctor.  Do not take aspirin or NSAIDs, such as ibuprofen, unless your doctor says it is okay.  Watch for any changes in your symptoms.  Keep all follow-up visits. Contact a doctor if:  You have new symptoms.  You lose weight and you do not know why.  You have trouble swallowing, or it hurts to swallow.  Your symptoms do not get better with treatment.  Your symptoms last for more than 2 days.  You have a fever.  You vomit. Get help right away if:  You have pain in your arms, neck, jaw, teeth, or back all of a sudden.  You feel sweaty, dizzy, or light-headed all of a sudden.  You faint.  You have chest pain or shortness of breath.  You cannot stop vomiting, or you vomit blood.  Your poop (stool) is bloody or black.  You have very bad pain in your belly.   These symptoms may be an emergency. Get help right away. Call your local emergency services (911 in the [place]  Do not wait to see if the symptoms will go away.  Do not drive yourself to the hospital. Summary  Indigestion is a feeling of pain, discomfort, burning, or fullness in the upper part of your belly. It tends to happen while you are eating or right after you have finished eating.  Follow an eating plan and other lifestyle changes as told by your doctor.  Take over-the-counter and prescription medicines only as told by your doctor. Do not take aspirin or NSAIDs, such as ibuprofen, unless your doctor says it is okay.  Contact  your doctor if your symptoms do not get better or they get worse.  Some symptoms may represent a serious problem that is an emergency. Do not wait to see if the symptoms will go away. Get medical help right away. This information is not intended to replace advice given to you by your health care provider. Make sure you discuss any questions you have with your health care provider. Document Revised: 10/27/2019 Document Reviewed: 10/27/2019 Elsevier Patient Education  2021 Elsevier Inc.  

## 2020-06-13 NOTE — Progress Notes (Signed)
ROB doing well. Just started feeling movement last week. U/s today for anatomy . Results reviewed. Discussed anterior placenta. Reviewed musculoskeletal discomforts and self help measure. Discussed PRE E /HELLP signs and symptoms. C/o indigestion. Has tried medication from med list. Requesting perscription.    +Patient Name: Anita Hurst DOB: March 27, 1996 MRN: [id number] ULTRASOUND REPORT  Location: [facility] Date of Service: 06/13/2020   Indications:Anatomy Ultrasound Findings:  Mason Jim intrauterine pregnancy is visualized with FHR at 140 BPM. Biometrics give an (U/S) Gestational age of [redacted]w[redacted]d and an (U/S) EDD of 10/25/2020; this correlates with the clinically established Estimated Date of Delivery: 10/21/20  Fetal presentation is Breech.  EFW: 381 g ( 13 oz). Fetal Percentile  Placenta: anterior. Grade: 1 AFI: subjectively normal.  Anatomic survey is complete and normal; Gender - female.    Right Ovary is normal in appearance. Left Ovary is normal appearance. Survey of the adnexa demonstrates no adnexal masses. There is no free peritoneal fluid in the cul de sac.  Impression: 1. [redacted]w[redacted]d Viable Singleton Intrauterine pregnancy by U/S. 2. (U/S) EDD is consistent with Clinically established Estimated Date of Delivery: 10/21/20 . 3. Normal Anatomy Scan  Recommendations: 1.Clinical correlation with the patient's History and Physical Exam.   Jenine M. Marciano Sequin    RDMS

## 2020-06-14 ENCOUNTER — Telehealth: Payer: Self-pay

## 2020-06-14 MED ORDER — FAMOTIDINE 10 MG PO TABS: 10.0000 mg | ORAL_TABLET | Freq: Two times a day (BID) | ORAL | 6 refills | Status: DC

## 2020-06-14 NOTE — Telephone Encounter (Signed)
Famotidine 10 mg #60 1 tab BID x 6 refills per pharmacy request

## 2020-07-11 ENCOUNTER — Ambulatory Visit (INDEPENDENT_AMBULATORY_CARE_PROVIDER_SITE_OTHER): Payer: Managed Care, Other (non HMO) | Admitting: Certified Nurse Midwife

## 2020-07-11 ENCOUNTER — Other Ambulatory Visit: Payer: Self-pay

## 2020-07-11 ENCOUNTER — Encounter: Payer: Self-pay | Admitting: Certified Nurse Midwife

## 2020-07-11 VITALS — BP 111/77 | HR 103 | Wt 202.4 lb

## 2020-07-11 DIAGNOSIS — Z3402 Encounter for supervision of normal first pregnancy, second trimester: Secondary | ICD-10-CM

## 2020-07-11 DIAGNOSIS — Z3A25 25 weeks gestation of pregnancy: Secondary | ICD-10-CM

## 2020-07-11 LAB — POCT URINALYSIS DIPSTICK OB
Appearance: NORMAL
Bilirubin, UA: NEGATIVE
Blood, UA: NEGATIVE
Glucose, UA: NEGATIVE
Ketones, UA: NEGATIVE
Leukocytes, UA: NEGATIVE
Nitrite, UA: NEGATIVE
Odor: NORMAL
POC,PROTEIN,UA: NEGATIVE
Spec Grav, UA: 1.01 (ref 1.010–1.025)
Urobilinogen, UA: 0.2 E.U./dL
pH, UA: 6.5 (ref 5.0–8.0)

## 2020-07-11 NOTE — Progress Notes (Signed)
ROB doing well, is feeling good movement. She c/o  Headaches, she states she takes tylenol that does not really help. She denies visual changes, epigastric pain. Reflexes today 1+ , negative clonus. Urine dip negative for protein today. Discussed repeating labs today vs waiting until her 28 wk visit. She request to wait. She is to notify me if she has any changes then we will repeat sooner. She verbalizes and agrees. Anticipatory guidance 28 wk visit. Follow up 3 wk or sooner with Marcelino Duster.

## 2020-07-11 NOTE — Patient Instructions (Signed)
Preeclampsia and Eclampsia Preeclampsia is a serious condition that may develop during pregnancy. This condition involves high blood pressure during pregnancy and causes symptoms such as headaches, vision changes, and increased swelling in the legs, hands, and face. Preeclampsia occurs after 20 weeks of pregnancy. Eclampsia is a seizure that happens from worsening preeclampsia. Diagnosing and managing preeclampsia early is important. If not treated early, it can cause serious problems for mother and baby. There is no cure for this condition. However, during pregnancy, delivering the baby may be the best treatment for preeclampsia or eclampsia. For most women, symptoms of preeclampsia and eclampsia go away after giving birth. In rare cases, a woman may develop preeclampsia or eclampsia after giving birth. This usually occurs within 48 hours after childbirth but may occur up to 6 weeks after giving birth. What are the causes? The cause of this condition is not known. What increases the risk? The following factors make you more likely to develop preeclampsia:  Being pregnant for the first time or being pregnant with multiples.  Having had preeclampsia or a condition called hemolysis, elevated liver enzymes, and low platelet count (HELLP)syndrome during a past pregnancy.  Having a family history of preeclampsia.  Being older than age 35.  Being obese.  Becoming pregnant through fertility treatments. Conditions that reduce blood flow or oxygen to your placenta and baby may also increase your risk. These include:  High blood pressure before, during, or immediately following pregnancy.  Kidney disease.  Diabetes.  Blood clotting disorders.  Autoimmune diseases, such as lupus.  Sleep apnea. What are the signs or symptoms? Common symptoms of this condition include:  A severe, throbbing headache that does not go away.  Vision problems, such as blurred or double vision and light  sensitivity.  Pain in the stomach, especially the right upper region.  Pain in the shoulder. Other symptoms that may develop as the condition gets worse include:  Sudden weight gain because of fluid buildup in the body. This causes swelling of the face, hands, legs, and feet.  Severe nausea and vomiting.  Urinating less than usual.  Shortness of breath.  Seizures. How is this diagnosed? Your health care provider will ask you about symptoms and check for signs of preeclampsia during your prenatal visits. You will also have routine tests, including:  Checking your blood pressure.  Urine tests to check for protein.  Blood tests to assess your organ function.  Monitoring your baby's heart rate.  Ultrasounds to check fetal growth.   How is this treated? You and your health care provider will determine the treatment that is best for you. Treatment may include:  Frequent prenatal visits to check for preeclampsia.  Medicine to lower your blood pressure.  Medicine to prevent seizures.  Low-dose aspirin during your pregnancy.  Staying in the hospital, in severe cases. You will be given medicines to control your blood pressure and the amount of fluids in your body.  Delivering your baby. Work with your health care provider to manage any chronic health conditions, such as diabetes or kidney problems. Also, work with your health care provider to manage weight gain during pregnancy. Follow these instructions at home: Eating and drinking  Drink enough fluid to keep your urine pale yellow.  Avoid caffeine. Caffeine may increase blood pressure and heart rate and lead to dehydration.  Reduce the amount of salt that you eat. Lifestyle  Do not use any products that contain nicotine or tobacco. These products include cigarettes, chewing tobacco, and   vaping devices, such as e-cigarettes. If you need help quitting, ask your health care provider.  Do not use alcohol or drugs.  Avoid  stress as much as possible.  Rest and get plenty of sleep. General instructions  Take over-the-counter and prescription medicines only as told by your health care provider.  When lying down, lie on your left side. This keeps pressure off your major blood vessels.  When sitting or lying down, raise (elevate) your feet. Try putting pillows underneath your lower legs.  Exercise regularly. Ask your health care provider what kinds of exercise are best for you.  Check your blood pressure as often as recommended by your health care provider.  Keep all prenatal and follow-up visits. This is important.   Contact a health care provider if:  You have symptoms that may need treatment or closer monitoring. These include: ? Headaches. ? Stomach pain or nausea and vomiting. ? Shoulder pain. ? Vision problems, such as spots in front of your eyes or blurry vision. ? Sudden weight gain or increased swelling in your face, hands, legs, and feet. ? Increased anxiety or feeling of impending doom. ? Signs or symptoms of labor. Get help right away if:  You have any of the following symptoms: ? A seizure. ? Shortness of breath or trouble breathing. ? Trouble speaking or slurred speech. ? Fainting. ? Chest pain. These symptoms may represent a serious problem that is an emergency. Do not wait to see if the symptoms will go away. Get medical help right away. Call your local emergency services (911 in the [place] Do not drive yourself to the hospital. Summary  Preeclampsia is a serious condition that may develop during pregnancy.  Diagnosing and treating preeclampsia early is very important.  Keep all prenatal and follow-up visits. This is important.  Get help right away if you have a seizure, shortness of breath or trouble breathing, trouble speaking or slurred speech, chest pain, or fainting. This information is not intended to replace advice given to you by your health care provider. Make sure you  discuss any questions you have with your health care provider. Document Revised: 01/13/2020 Document Reviewed: 01/13/2020 Elsevier Patient Education  2021 Elsevier Inc.  

## 2020-07-11 NOTE — Progress Notes (Signed)
ROB: she complains of migraines x 1.5 weeks. Other wise doing well.

## 2020-08-03 ENCOUNTER — Encounter: Payer: Managed Care, Other (non HMO) | Admitting: Certified Nurse Midwife

## 2020-08-03 ENCOUNTER — Other Ambulatory Visit: Payer: Self-pay

## 2020-08-03 ENCOUNTER — Other Ambulatory Visit: Payer: Managed Care, Other (non HMO)

## 2020-08-03 ENCOUNTER — Encounter: Payer: Self-pay | Admitting: Certified Nurse Midwife

## 2020-08-03 ENCOUNTER — Ambulatory Visit (INDEPENDENT_AMBULATORY_CARE_PROVIDER_SITE_OTHER): Payer: Managed Care, Other (non HMO) | Admitting: Certified Nurse Midwife

## 2020-08-03 VITALS — BP 119/83 | HR 106 | Wt 205.2 lb

## 2020-08-03 DIAGNOSIS — Z3A28 28 weeks gestation of pregnancy: Secondary | ICD-10-CM

## 2020-08-03 DIAGNOSIS — Z113 Encounter for screening for infections with a predominantly sexual mode of transmission: Secondary | ICD-10-CM

## 2020-08-03 DIAGNOSIS — Z3403 Encounter for supervision of normal first pregnancy, third trimester: Secondary | ICD-10-CM

## 2020-08-03 DIAGNOSIS — Z130 Encounter for screening for diseases of the blood and blood-forming organs and certain disorders involving the immune mechanism: Secondary | ICD-10-CM

## 2020-08-03 DIAGNOSIS — O0993 Supervision of high risk pregnancy, unspecified, third trimester: Secondary | ICD-10-CM

## 2020-08-03 DIAGNOSIS — O09299 Supervision of pregnancy with other poor reproductive or obstetric history, unspecified trimester: Secondary | ICD-10-CM

## 2020-08-03 DIAGNOSIS — O34219 Maternal care for unspecified type scar from previous cesarean delivery: Secondary | ICD-10-CM

## 2020-08-03 DIAGNOSIS — H43393 Other vitreous opacities, bilateral: Secondary | ICD-10-CM | POA: Insufficient documentation

## 2020-08-03 DIAGNOSIS — Z98891 History of uterine scar from previous surgery: Secondary | ICD-10-CM

## 2020-08-03 DIAGNOSIS — Z131 Encounter for screening for diabetes mellitus: Secondary | ICD-10-CM

## 2020-08-03 NOTE — Patient Instructions (Addendum)
Common Medications Safe in Pregnancy  Acne:      Constipation:  Benzoyl Peroxide     Colace  Clindamycin      Dulcolax Suppository  Topica Erythromycin     Fibercon  Salicylic Acid      Metamucil         Miralax AVOID:        Senakot   Accutane    Cough:  Retin-A       Cough Drops  Tetracycline      Phenergan w/ Codeine if Rx  Minocycline      Robitussin (Plain & DM)  Antibiotics:     Crabs/Lice:  Ceclor       RID  Cephalosporins    AVOID:  E-Mycins      Kwell  Keflex  Macrobid/Macrodantin   Diarrhea:  Penicillin      Kao-Pectate  Zithromax      Imodium AD         PUSH FLUIDS AVOID:       Cipro     Fever:  Tetracycline      Tylenol (Regular or Extra  Minocycline       Strength)  Levaquin      Extra Strength-Do not          Exceed 8 tabs/24 hrs Caffeine:        <200mg/day (equiv. To 1 cup of coffee or  approx. 3 12 oz sodas)         Gas: Cold/Hayfever:       Gas-X  Benadryl      Mylicon  Claritin       Phazyme  **Claritin-D        Chlor-Trimeton    Headaches:  Dimetapp      ASA-Free Excedrin  Drixoral-Non-Drowsy     Cold Compress  Mucinex (Guaifenasin)     Tylenol (Regular or Extra  Sudafed/Sudafed-12 Hour     Strength)  **Sudafed PE Pseudoephedrine   Tylenol Cold & Sinus     Vicks Vapor Rub  Zyrtec  **AVOID if Problems With Blood Pressure         Heartburn: Avoid lying down for at least 1 hour after meals  Aciphex      Maalox     Rash:  Milk of Magnesia     Benadryl    Mylanta       1% Hydrocortisone Cream  Pepcid  Pepcid Complete   Sleep Aids:  Prevacid      Ambien   Prilosec       Benadryl  Rolaids       Chamomile Tea  Tums (Limit 4/day)     Unisom         Tylenol PM         Warm milk-add vanilla or  Hemorrhoids:       Sugar for taste  Anusol/Anusol H.C.  (RX: Analapram 2.5%)  Sugar Substitutes:  Hydrocortisone OTC     Ok in moderation  Preparation H      Tucks        Vaseline lotion applied to tissue with  wiping    Herpes:     Throat:  Acyclovir      Oragel  Famvir  Valtrex     Vaccines:         Flu Shot Leg Cramps:       *Gardasil  Benadryl      Hepatitis A         Hepatitis B Nasal Spray:         Pneumovax  Saline Nasal Spray     Polio Booster         Tetanus Nausea:       Tuberculosis test or PPD  Vitamin B6 25 mg TID   AVOID:    Dramamine      *Gardasil  Emetrol       Live Poliovirus  Ginger Root 250 mg QID    MMR (measles, mumps &  High Complex Carbs @ Bedtime    rebella)  Sea Bands-Accupressure    Varicella (Chickenpox)  Unisom 1/2 tab TID     *No known complications           If received before Pain:         Known pregnancy;   Darvocet       Resume series after  Lortab        Delivery  Percocet    Yeast:   Tramadol      Femstat  Tylenol 3      Gyne-lotrimin  Ultram       Monistat  Vicodin           MISC:         All Sunscreens           Hair Coloring/highlights          Insect Repellant's          (Including DEET)         Mystic Tans   Third Trimester of Pregnancy  The third trimester of pregnancy is from week 28 through week 40. This is also called months 7 through 9. This trimester is when your unborn baby (fetus) is growing very fast. At the end of the ninth month, the unborn baby is about 20 inches long. It weighs about 6-10 pounds. Body changes during your third trimester Your body continues to go through many changes during this time. The changes vary and generally return to normal after the baby is born. Physical changes  Your weight will continue to increase. You may gain 25-35 pounds (11-16 kg) by the end of the pregnancy. If you are underweight, you may gain 28-40 lb (about 13-18 kg). If you are overweight, you may gain 15-25 lb (about 7-11 kg).  You may start to get stretch marks on your hips, belly (abdomen), and breasts.  Your breasts will continue to grow and may hurt. A yellow fluid (colostrum) may leak from your breasts. This is the first milk you  are making for your baby.  You may have changes in your hair.  Your belly button may stick out.  You may have more swelling in your hands, face, or ankles. Health changes  You may have heartburn.  You may have trouble pooping (constipation).  You may get hemorrhoids. These are swollen veins in the butt that can itch or get painful.  You may have swollen veins (varicose veins) in your legs.  You may have more body aches in the pelvis, back, or thighs.  You may have more tingling or numbness in your hands, arms, and legs. The skin on your belly may also feel numb.  You may feel short of breath as your womb (uterus) gets bigger. Other changes  You may pee (urinate) more often.  You may have more problems sleeping.  You may notice the unborn baby "dropping," or moving lower in your belly.  You may have more discharge coming from your vagina.  Your joints may feel loose, and you may have pain  around your pelvic bone. Follow these instructions at home: Medicines  Take over-the-counter and prescription medicines only as told by your doctor. Some medicines are not safe during pregnancy.  Take a prenatal vitamin that contains at least 600 micrograms (mcg) of folic acid. Eating and drinking  Eat healthy meals that include: ? Fresh fruits and vegetables. ? Whole grains. ? Good sources of protein, such as meat, eggs, or tofu. ? Low-fat dairy products.  Avoid raw meat and unpasteurized juice, milk, and cheese. These carry germs that can harm you and your baby.  Eat 4 or 5 small meals rather than 3 large meals a day.  You may need to take these actions to prevent or treat trouble pooping: ? Drink enough fluids to keep your pee (urine) pale yellow. ? Eat foods that are high in fiber. These include beans, whole grains, and fresh fruits and vegetables. ? Limit foods that are high in fat and sugar. These include fried or sweet foods. Activity  Exercise only as told by your  doctor. Stop exercising if you start to have cramps in your womb.  Avoid heavy lifting.  Do not exercise if it is too hot or too humid, or if you are in a place of great height (high altitude).  If you choose to, you may have sex unless your doctor tells you not to. Relieving pain and discomfort  Take breaks often, and rest with your legs raised (elevated) if you have leg cramps or low back pain.  Take warm water baths (sitz baths) to soothe pain or discomfort caused by hemorrhoids. Use hemorrhoid cream if your doctor approves.  Wear a good support bra if your breasts are tender.  If you develop bulging, swollen veins in your legs: ? Wear support hose as told by your doctor. ? Raise your feet for 15 minutes, 3-4 times a day. ? Limit salt in your food. Safety  Talk to your doctor before traveling far distances.  Do not use hot tubs, steam rooms, or saunas.  Wear your seat belt at all times when you are in a car.  Talk with your doctor if someone is hurting you or yelling at you a lot. Preparing for your baby's arrival To prepare for the arrival of your baby:  Take prenatal classes.  Visit the hospital and tour the maternity area.  Buy a rear-facing car seat. Learn how to install it in your car.  Prepare the baby's room. Take out all pillows and stuffed animals from the baby's crib. General instructions  Avoid cat litter boxes and soil used by cats. These carry germs that can cause harm to the baby and can cause a loss of your baby by miscarriage or stillbirth.  Do not douche or use tampons. Do not use scented sanitary pads.  Do not smoke or use any products that contain nicotine or tobacco. If you need help quitting, ask your doctor.  Do not drink alcohol.  Do not use herbal medicines, illegal drugs, or medicines that were not approved by your doctor. Chemicals in these products can affect your baby.  Keep all follow-up visits. This is important. Where to find more  information  American Pregnancy Association: americanpregnancy.org  [employer]: www.acog.org  Office on Women's Health: KeywordPortfolios.com.br Contact a doctor if:  You have a fever.  You have mild cramps or pressure in your lower belly.  You have a nagging pain in your belly area.  You vomit, or you have  watery poop (diarrhea).  You have bad-smelling fluid coming from your vagina.  You have pain when you pee, or your pee smells bad.  You have a headache that does not go away when you take medicine.  You have changes in how you see, or you see spots in front of your eyes. Get help right away if:  Your water breaks.  You have regular contractions that are less than 5 minutes apart.  You are spotting or bleeding from your vagina.  You have very bad belly cramps or pain.  You have trouble breathing.  You have chest pain.  You faint.  You have not felt the baby move for the amount of time told by your doctor.  You have new or increased pain, swelling, or redness in an arm or leg. Summary  The third trimester is from week 28 through week 40 (months 7 through 9). This is the time when your unborn baby is growing very fast.  During this time, your discomfort may increase as you gain weight and as your baby grows.  Get ready for your baby to arrive by taking prenatal classes, buying a rear-facing car seat, and preparing the baby's room.  Get help right away if you are bleeding from your vagina, you have chest pain and trouble breathing, or you have not felt the baby move for the amount of time told by your doctor. This information is not intended to replace advice given to you by your health care provider. Make sure you discuss any questions you have with your health care provider. Document Revised: 09/29/2019 Document Reviewed: 08/05/2019 Elsevier Patient Education  2021 [employer].

## 2020-08-03 NOTE — Progress Notes (Signed)
ROB: She has concerns of frequent floaters. She said she still has not had her pre-eclampsia labs done. Otherwise she is doing well.

## 2020-08-03 NOTE — Progress Notes (Addendum)
ROB- Reports twice daily episodes of self resolving visual floaters (an increase since last appointment) and daily headaches that resolve with home treatment measures. Still needs baseline PIH labs; will collect, see orders. Offer to send to patient to [facility] draw station or hospital declined. Breastfeeding teaching, blood consent, and overview/plan completed today. Third trimester handouts given. Desires paragard IUD for postpartum contraception. Anticipatory guidance regarding course of prenatal care. Reviewed red flag symptoms and when to call.  RTC on 4/5 for labs and ROB with ANNIE; RTC x 3 weeks for ROB with JML or sooner if needed.   Juliann Pares, Student-[employer]ife [employer] 08/03/20 4:47 PM

## 2020-08-03 NOTE — Progress Notes (Signed)
I have seen, interviewed, and examined the patient in conjunction with the [employer] and affirm the diagnosis and management plan.   Anita Hurst, CNM [facility], [facility] 08/03/20 4:57 PM

## 2020-08-08 ENCOUNTER — Other Ambulatory Visit: Payer: Self-pay

## 2020-08-08 ENCOUNTER — Other Ambulatory Visit: Payer: Managed Care, Other (non HMO)

## 2020-08-08 ENCOUNTER — Ambulatory Visit (INDEPENDENT_AMBULATORY_CARE_PROVIDER_SITE_OTHER): Payer: Managed Care, Other (non HMO) | Admitting: Certified Nurse Midwife

## 2020-08-08 ENCOUNTER — Encounter: Payer: Self-pay | Admitting: Certified Nurse Midwife

## 2020-08-08 VITALS — BP 130/84 | HR 116 | Wt 204.9 lb

## 2020-08-08 DIAGNOSIS — Z130 Encounter for screening for diseases of the blood and blood-forming organs and certain disorders involving the immune mechanism: Secondary | ICD-10-CM

## 2020-08-08 DIAGNOSIS — Z131 Encounter for screening for diabetes mellitus: Secondary | ICD-10-CM

## 2020-08-08 DIAGNOSIS — Z23 Encounter for immunization: Secondary | ICD-10-CM | POA: Diagnosis not present

## 2020-08-08 DIAGNOSIS — Z3403 Encounter for supervision of normal first pregnancy, third trimester: Secondary | ICD-10-CM

## 2020-08-08 DIAGNOSIS — Z113 Encounter for screening for infections with a predominantly sexual mode of transmission: Secondary | ICD-10-CM

## 2020-08-08 DIAGNOSIS — O09299 Supervision of pregnancy with other poor reproductive or obstetric history, unspecified trimester: Secondary | ICD-10-CM

## 2020-08-08 DIAGNOSIS — Z3A29 29 weeks gestation of pregnancy: Secondary | ICD-10-CM

## 2020-08-08 DIAGNOSIS — O0993 Supervision of high risk pregnancy, unspecified, third trimester: Secondary | ICD-10-CM

## 2020-08-08 DIAGNOSIS — Z3A28 28 weeks gestation of pregnancy: Secondary | ICD-10-CM

## 2020-08-08 DIAGNOSIS — H43393 Other vitreous opacities, bilateral: Secondary | ICD-10-CM

## 2020-08-08 DIAGNOSIS — Z3402 Encounter for supervision of normal first pregnancy, second trimester: Secondary | ICD-10-CM

## 2020-08-08 LAB — POCT URINALYSIS DIPSTICK OB
Appearance: NORMAL
Bilirubin, UA: NEGATIVE
Blood, UA: NEGATIVE
Glucose, UA: NEGATIVE
Ketones, UA: NEGATIVE
Leukocytes, UA: NEGATIVE
Nitrite, UA: NEGATIVE
Odor: NORMAL
POC,PROTEIN,UA: NEGATIVE
Spec Grav, UA: 1.01 (ref 1.010–1.025)
Urobilinogen, UA: 0.2 E.U./dL
pH, UA: 8.5 — AB (ref 5.0–8.0)

## 2020-08-08 NOTE — Patient Instructions (Signed)
Td (Tetanus, Diphtheria) Vaccine: What You Need to Know 1. Why get vaccinated? Td vaccine can prevent tetanus and diphtheria. Tetanus enters the body through cuts or wounds. Diphtheria spreads from person to person.  TETANUS (T) causes painful stiffening of the muscles. Tetanus can lead to serious health problems, including being unable to open the mouth, having trouble swallowing and breathing, or death.  DIPHTHERIA (D) can lead to difficulty breathing, heart failure, paralysis, or death. 2. Td vaccine Td is only for children 7 years and older, adolescents, and adults.  Td is usually given as a booster dose every 10 years, or after 5 years in the case of a severe or dirty wound or burn. Another vaccine, called "Tdap," may be used instead of Td. Tdap protects against pertussis, also known as "whooping cough," in addition to tetanus and diphtheria. Td may be given at the same time as other vaccines. 3. Talk with your health care provider Tell your vaccination provider if the person getting the vaccine:  Has had an allergic reaction after a previous dose of any vaccine that protects against tetanus or diphtheria, or has any severe, life-threatening allergies  Has ever had Guillain-Barr Syndrome (also called "GBS")  Has had severe pain or swelling after a previous dose of any vaccine that protects against tetanus or diphtheria In some cases, your health care provider may decide to postpone Td vaccination until a future visit. People with minor illnesses, such as a cold, may be vaccinated. People who are moderately or severely ill should usually wait until they recover before getting Td vaccine.  Your health care provider can give you more information. 4. Risks of a vaccine reaction  Pain, redness, or swelling where the shot was given, mild fever, headache, feeling tired, and nausea, vomiting, diarrhea, or stomachache sometimes happen after Td vaccination. People sometimes faint after medical  procedures, including vaccination. Tell your provider if you feel dizzy or have vision changes or ringing in the ears.  As with any medicine, there is a very remote chance of a vaccine causing a severe allergic reaction, other serious injury, or death. 5. What if there is a serious problem? An allergic reaction could occur after the vaccinated person leaves the clinic. If you see signs of a severe allergic reaction (hives, swelling of the face and throat, difficulty breathing, a fast heartbeat, dizziness, or weakness), call 9-1-1 and get the person to the nearest hospital.  For other signs that concern you, call your health care provider.  Adverse reactions should be reported to the Vaccine Adverse Event Reporting System (VAERS). Your health care provider will usually file this report, or you can do it yourself. Visit the VAERS website at www.vaers.hhs.gov or call 1-800-822-7967. VAERS is only for reporting reactions, and VAERS staff members do not give medical advice. 6. The National Vaccine Injury Compensation Program The National Vaccine Injury Compensation Program (VICP) is a federal program that was created to compensate people who may have been injured by certain vaccines. Claims regarding alleged injury or death due to vaccination have a time limit for filing, which may be as short as two years. Visit the VICP website at www.hrsa.gov/vaccinecompensation or call 1-800-338-2382 to learn about the program and about filing a claim. 7. How can I learn more?  Ask your health care provider.  Call your local or state health department.  Visit the website of the Food and Drug Administration (FDA) for vaccine package inserts and additional information at www.fda.gov/vaccines-blood-biologics/vaccines.  Contact the Centers for   Disease Control and Prevention (CDC): ? Call 1-800-232-4636 (1-800-CDC-INFO) or ? Visit CDC's website at www.cdc.gov/vaccines. Vaccine Information Statement Td (Tetanus,  Diphtheria) Vaccine (12/10/2019) This information is not intended to replace advice given to you by your health care provider. Make sure you discuss any questions you have with your health care provider. Document Revised: 01/27/2020 Document Reviewed: 01/27/2020 Elsevier Patient Education  2021 Elsevier Inc.  

## 2020-08-08 NOTE — Progress Notes (Signed)
ROB: She continues to have frequent floaters. Otherwise she states she is doing well, no concerns.

## 2020-08-08 NOTE — Progress Notes (Signed)
ROB doing well. Feels good movement. Glucose screen today/ RPR/CBC/CMP/Tdap/BTC today. Discussed Ready set baby, see check list. Discussed birth control -planning para guard. Sample birth plan given. Will follow up next appointment. She continues to have floaters , denies any other symptoms. PIH labs repeated today. PT state she is fine with having them done frequently as the last pregnancy the only symptom she had was the visual changes. Will follow up with lab results. Return in 2 wk with Marcelino Duster.   Doreene Burke, CNM

## 2020-08-09 LAB — CBC
Hematocrit: 31.2 % — ABNORMAL LOW (ref 34.0–46.6)
Hemoglobin: 10.7 g/dL — ABNORMAL LOW (ref 11.1–15.9)
MCH: 30.4 pg (ref 26.6–33.0)
MCHC: 34.3 g/dL (ref 31.5–35.7)
MCV: 89 fL (ref 79–97)
Platelets: 315 10*3/uL (ref 150–450)
RBC: 3.52 x10E6/uL — ABNORMAL LOW (ref 3.77–5.28)
RDW: 12 % (ref 11.7–15.4)
WBC: 12.8 10*3/uL — ABNORMAL HIGH (ref 3.4–10.8)

## 2020-08-09 LAB — COMPREHENSIVE METABOLIC PANEL
ALT: 15 IU/L (ref 0–32)
AST: 13 IU/L (ref 0–40)
Albumin/Globulin Ratio: 1.5 (ref 1.2–2.2)
Albumin: 3.6 g/dL — ABNORMAL LOW (ref 3.9–5.0)
Alkaline Phosphatase: 68 IU/L (ref 44–121)
BUN/Creatinine Ratio: 11 (ref 9–23)
BUN: 6 mg/dL (ref 6–20)
Bilirubin Total: <0.2 mg/dL (ref 0.0–1.2)
CO2: 21 mmol/L (ref 20–29)
Calcium: 9.6 mg/dL (ref 8.7–10.2)
Chloride: 103 mmol/L (ref 96–106)
Creatinine, Ser: 0.57 mg/dL (ref 0.57–1.00)
Globulin, Total: 2.4 g/dL (ref 1.5–4.5)
Glucose: 94 mg/dL (ref 65–99)
Potassium: 3.8 mmol/L (ref 3.5–5.2)
Sodium: 140 mmol/L (ref 134–144)
Total Protein: 6 g/dL (ref 6.0–8.5)
eGFR: 130 mL/min/{1.73_m2} (ref >59)

## 2020-08-09 LAB — GLUCOSE TOLERANCE, 1 HOUR: Glucose, 1Hr PP: 104 mg/dL (ref 65–199)

## 2020-08-09 LAB — PROTEIN / CREATININE RATIO, URINE
Creatinine, Urine: 55.9 mg/dL
Protein, Ur: 6.7 mg/dL
Protein/Creat Ratio: 120 mg/g creat (ref 0–200)

## 2020-08-09 LAB — RPR: RPR Ser Ql: NONREACTIVE

## 2020-08-11 ENCOUNTER — Encounter: Payer: Self-pay | Admitting: Certified Nurse Midwife

## 2020-08-11 DIAGNOSIS — O99019 Anemia complicating pregnancy, unspecified trimester: Secondary | ICD-10-CM | POA: Insufficient documentation

## 2020-08-22 ENCOUNTER — Encounter: Payer: Self-pay | Admitting: Certified Nurse Midwife

## 2020-08-22 ENCOUNTER — Other Ambulatory Visit: Payer: Self-pay

## 2020-08-22 ENCOUNTER — Ambulatory Visit (INDEPENDENT_AMBULATORY_CARE_PROVIDER_SITE_OTHER): Payer: Managed Care, Other (non HMO) | Admitting: Certified Nurse Midwife

## 2020-08-22 VITALS — BP 118/78 | HR 105 | Wt 206.3 lb

## 2020-08-22 DIAGNOSIS — Z3A31 31 weeks gestation of pregnancy: Secondary | ICD-10-CM

## 2020-08-22 DIAGNOSIS — Z3403 Encounter for supervision of normal first pregnancy, third trimester: Secondary | ICD-10-CM

## 2020-08-22 LAB — POCT URINALYSIS DIPSTICK OB
Bilirubin, UA: NEGATIVE
Blood, UA: NEGATIVE
Glucose, UA: NEGATIVE
Ketones, UA: NEGATIVE
Leukocytes, UA: NEGATIVE
Nitrite, UA: NEGATIVE
POC,PROTEIN,UA: NEGATIVE
Spec Grav, UA: 1.01 (ref 1.010–1.025)
Urobilinogen, UA: 0.2 E.U./dL
pH, UA: 7.5 (ref 5.0–8.0)

## 2020-08-22 NOTE — Progress Notes (Signed)
Rob doing well. Feels good movement. PT states she feels movement low in the vagina. Discussed possible breech presentation. Leopold's maneuver to appreciate fetal parts due to maternal body habitus and size of fetus. Discussed spinning babies, work sheet given. Discussed continued evaluate for fetal position and u/s if needed. Reviewed birth plan, she would like a VBAC , epidural , and health mom & baby.  She denies any signs or pre eclampsia . Follow up 2 wks with Marcelino Duster for ROB.   Doreene Burke, CNM

## 2020-08-22 NOTE — Progress Notes (Signed)
ROB: doing well. She has some right lower abdominal pain due to position she slept in. Continues to have floaters.

## 2020-08-22 NOTE — Patient Instructions (Signed)
Preterm Labor Pregnancy normally lasts 39-41 weeks. Preterm labor is when labor starts before you have been pregnant for 37 weeks. Babies who are born too early may have problems with blood sugar, body temperature, heart, and breathing. These problems may be very serious in babies who are born before 34 weeks of pregnancy. What are the causes? The cause of this condition is not known. What increases the risk? You are more likely to have preterm labor if:  You have medical problems, now or in the past.  You have problems now or in your past pregnancies.  You have lifestyle problems. Medical history  You have problems of the womb (uterus).  You have an infection, including infections you get from sex.  You have problems that do not go away, such as: ? Blood clots. ? High blood pressure. ? High blood sugar.  You have low body weight or too much body weight. Present and past pregnancies  You have had preterm labor before.  You are pregnant with two babies or more.  You have a condition in which the placenta covers your cervix.  You waited less than 6 months between giving birth and becoming pregnant again.  Your unborn baby has some problems.  You have bleeding from your vagina.  You became pregnant by a method called IVF. Lifestyle  You smoke.  You drink alcohol.  You use drugs.  You have stress.  You have abuse in your home.  You come in contact with chemicals that harm the body (pollutants). Other factors  You are younger than 17 years or older than 35 years. What are the signs or symptoms? Symptoms of this condition include:  Cramps. The cramps may feel like cramps from a period.  You may have watery poop (diarrhea).  Pain in the belly (abdomen).  Pain in the lower back.  Regular contractions. It may feel like your belly is getting tighter.  Pressure in the lower belly.  More fluid leaking from the vagina. The fluid may be watery or  bloody.  Water breaking. How is this treated? Treatment for this condition depends on your health, the health of your baby, and how old your pregnancy is. It may include:  Taking medicines, such as: ? Hormone medicines. ? Medicines to stop contractions. ? Medicines to help mature the baby's lungs. ? Medicines to prevent your baby from getting cerebral palsy.  Bed rest. If the labor happens before 34 weeks of pregnancy, you may need to stay in the hospital.  Delivering the baby. Follow these instructions at home:  Do not use any products that contain nicotine or tobacco, such as cigarettes, e-cigarettes, and chewing tobacco. If you need help quitting, ask your doctor.  Do not drink alcohol.  Take over-the-counter and prescription medicines only as told by your doctor.  Rest as told by your doctor.  Return to your activities as told by your doctor. Ask your doctor what activities are safe for you.  Keep all follow-up visits as told by your doctor. This is important.   How is this prevented? To have a healthy pregnancy:  Do not use street drugs.  Do not use any medicines unless you ask your doctor if they are safe for you.  Talk with your doctor before taking any herbal supplements.  Make sure you gain enough weight.  Watch for infection. If you think you might have an infection, get it checked right away. Symptoms of infection may include: ? Fever. ? Vaginal discharge. ?   Pain or burning when you pee. ? Needing to pee urgently. ? Needing to pee often. ? Peeing small amounts often. ? Blood in your pee. ? Pee that smells bad or unusual.  Tell your doctor if you have gone into preterm labor before. Contact a doctor if:  You think you are going into preterm labor.  You have symptoms of preterm labor.  You have symptoms of infection. Get help right away if:  You are having painful contractions every 5 minutes or less.  Your water breaks. Summary  Preterm labor  is labor that starts before you reach 37 weeks of pregnancy.  Your baby may have problems if delivered early.  The cause of preterm labor is not known. Having problems of the womb (uterus), an infection, or bleeding during pregnancy increases the risk.  Contact a doctor if you have signs or symptoms of preterm labor. This information is not intended to replace advice given to you by your health care provider. Make sure you discuss any questions you have with your health care provider. Document Revised: 05/25/2019 Document Reviewed: 05/25/2019 Elsevier Patient Education  2021 Elsevier Inc.  

## 2020-08-28 ENCOUNTER — Telehealth: Payer: Self-pay | Admitting: Certified Nurse Midwife

## 2020-08-28 DIAGNOSIS — R200 Anesthesia of skin: Secondary | ICD-10-CM

## 2020-08-28 DIAGNOSIS — O0993 Supervision of high risk pregnancy, unspecified, third trimester: Secondary | ICD-10-CM

## 2020-08-28 DIAGNOSIS — Z3A32 32 weeks gestation of pregnancy: Secondary | ICD-10-CM

## 2020-08-28 NOTE — Telephone Encounter (Signed)
Telephone call to patient, verified full name and date of birth.   Reports intermittent left hip and leg pain and numbness down to knees.   Notes negative homan sign. Denies swelling to legs and ankles.   Recommend Spinning Babies "[employer] Balance" and side lying release.   Next appointment, 09/04/2020. If symptoms don't resolve with home treatment measures.   Reviewed red flag symptoms and when to call.    Serafina Royals, CNM [facility], [facility] 08/28/20 5:03 PM

## 2020-08-28 NOTE — Telephone Encounter (Signed)
New Message:     Pt stated she is 32 weeks.  She is having a some numbness and tingling in her thighs and hip.

## 2020-09-04 ENCOUNTER — Other Ambulatory Visit: Payer: Self-pay

## 2020-09-04 ENCOUNTER — Ambulatory Visit (INDEPENDENT_AMBULATORY_CARE_PROVIDER_SITE_OTHER): Payer: Managed Care, Other (non HMO) | Admitting: Certified Nurse Midwife

## 2020-09-04 VITALS — BP 112/78 | HR 114 | Wt 208.8 lb

## 2020-09-04 DIAGNOSIS — O09299 Supervision of pregnancy with other poor reproductive or obstetric history, unspecified trimester: Secondary | ICD-10-CM

## 2020-09-04 DIAGNOSIS — O0993 Supervision of high risk pregnancy, unspecified, third trimester: Secondary | ICD-10-CM

## 2020-09-04 DIAGNOSIS — R200 Anesthesia of skin: Secondary | ICD-10-CM

## 2020-09-04 DIAGNOSIS — O34219 Maternal care for unspecified type scar from previous cesarean delivery: Secondary | ICD-10-CM

## 2020-09-04 DIAGNOSIS — Z3A33 33 weeks gestation of pregnancy: Secondary | ICD-10-CM

## 2020-09-04 DIAGNOSIS — Z98891 History of uterine scar from previous surgery: Secondary | ICD-10-CM

## 2020-09-04 LAB — POCT URINALYSIS DIPSTICK OB
Bilirubin, UA: NEGATIVE
Blood, UA: NEGATIVE
Glucose, UA: NEGATIVE
Ketones, UA: NEGATIVE
Leukocytes, UA: NEGATIVE
Nitrite, UA: NEGATIVE
Spec Grav, UA: 1.015 (ref 1.010–1.025)
Urobilinogen, UA: 0.2 E.U./dL
pH, UA: 6.5 (ref 5.0–8.0)

## 2020-09-04 NOTE — Progress Notes (Signed)
ROB: She has been having some back pain and round ligament pain. She has also been having some left leg numbness and tingling from her hip to knee.

## 2020-09-04 NOTE — Progress Notes (Signed)
ROB- Reports back pain when sitting, round ligament pain and left leg sciatica. Discussed home treatment measures and three sisters of balance handout provided. Notes received handout last week as well, no longer wearing abdominal support due to discomfort. Vertex presentation verified with bedside ultrasound by CNM. Anticipatory guidance regarding course of prenatal care. Reviewed red flag symptoms and when to call. RTC x 1 week for VBAC consult with Dr. Valentino Saxon; RTC x 2 weeks for ROB with ANNIE or sooner if needed.   Juliann Pares, Student-Mid[employer]ife [employer] 09/04/20 4:19 PM

## 2020-09-04 NOTE — Patient Instructions (Addendum)
Sciatica  Sciatica is pain, weakness, tingling, or loss of feeling (numbness) along the sciatic nerve. The sciatic nerve starts in the lower back and goes down the back of each leg. Sciatica usually goes away on its own or with treatment. Sometimes, sciatica may come back (recur). What are the causes? This condition happens when the sciatic nerve is pinched or has pressure put on it. This may be the result of:  A disk in between the bones of the spine bulging out too far (herniated disk).  Changes in the spinal disks that occur with aging.  A condition that affects a muscle in the butt.  Extra bone growth near the sciatic nerve.  A break (fracture) of the area between your hip bones (pelvis).  Pregnancy.  Tumor. This is rare. What increases the risk? You are more likely to develop this condition if you:  Play sports that put pressure or stress on the spine.  Have poor strength and ease of movement (flexibility).  Have had a back injury in the past.  Have had back surgery.  Sit for long periods of time.  Do activities that involve bending or lifting over and over again.  Are very overweight (obese). What are the signs or symptoms? Symptoms can vary from mild to very bad. They may include:  Any of these problems in the lower back, leg, hip, or butt: ? Mild tingling, loss of feeling, or dull aches. ? Burning sensations. ? Sharp pains.  Loss of feeling in the back of the calf or the sole of the foot.  Leg weakness.  Very bad back pain that makes it hard to move. These symptoms may get worse when you cough, sneeze, or laugh. They may also get worse when you sit or stand for long periods of time. How is this treated? This condition often gets better without any treatment. However, treatment may include:  Changing or cutting back on physical activity when you have pain.  Doing exercises and stretching.  Putting ice or heat on the affected area.  Medicines that  help: ? To relieve pain and swelling. ? To relax your muscles.  Shots (injections) of medicines that help to relieve pain, irritation, and swelling.  Surgery. Follow these instructions at home: Medicines  Take over-the-counter and prescription medicines only as told by your doctor.  Ask your doctor if the medicine prescribed to you: ? Requires you to avoid driving or using heavy machinery. ? Can cause trouble pooping (constipation). You may need to take these steps to prevent or treat trouble pooping:  Drink enough fluids to keep your pee (urine) pale yellow.  Take over-the-counter or prescription medicines.  Eat foods that are high in fiber. These include beans, whole grains, and fresh fruits and vegetables.  Limit foods that are high in fat and sugar. These include fried or sweet foods. Managing pain  If told, put ice on the affected area. ? Put ice in a plastic bag. ? Place a towel between your skin and the bag. ? Leave the ice on for 20 minutes, 2-3 times a day.  If told, put heat on the affected area. Use the heat source that your doctor tells you to use, such as a moist heat pack or a heating pad. ? Place a towel between your skin and the heat source. ? Leave the heat on for 20-30 minutes. ? Remove the heat if your skin turns bright red. This is very important if you are unable to feel pain,  heat, or cold. You may have a greater risk of getting burned.      Activity  Return to your normal activities as told by your doctor. Ask your doctor what activities are safe for you.  Avoid activities that make your symptoms worse.  Take short rests during the day. ? When you rest for a long time, do some physical activity or stretching between periods of rest. ? Avoid sitting for a long time without moving. Get up and move around at least one time each hour.  Exercise and stretch regularly, as told by your doctor.  Do not lift anything that is heavier than 10 lb (4.5 kg)  while you have symptoms of sciatica. ? Avoid lifting heavy things even when you do not have symptoms. ? Avoid lifting heavy things over and over.  When you lift objects, always lift in a way that is safe for your body. To do this, you should: ? Bend your knees. ? Keep the object close to your body. ? Avoid twisting.   General instructions  Stay at a healthy weight.  Wear comfortable shoes that support your feet. Avoid wearing high heels.  Avoid sleeping on a mattress that is too soft or too hard. You might have less pain if you sleep on a mattress that is firm enough to support your back.  Keep all follow-up visits as told by your doctor. This is important. Contact a doctor if:  You have pain that: ? Wakes you up when you are sleeping. ? Gets worse when you lie down. ? Is worse than the pain you have had in the past. ? Lasts longer than 4 weeks.  You lose weight without trying. Get help right away if:  You cannot control when you pee (urinate) or poop (have a bowel movement).  You have weakness in any of these areas and it gets worse: ? Lower back. ? The area between your hip bones. ? Butt. ? Legs.  You have redness or swelling of your back.  You have a burning feeling when you pee. Summary  Sciatica is pain, weakness, tingling, or loss of feeling (numbness) along the sciatic nerve.  This condition happens when the sciatic nerve is pinched or has pressure put on it.  Sciatica can cause pain, tingling, or loss of feeling (numbness) in the lower back, legs, hips, and butt.  Treatment often includes rest, exercise, medicines, and putting ice or heat on the affected area. This information is not intended to replace advice given to you by your health care provider. Make sure you discuss any questions you have with your health care provider. Document Revised: 05/11/2018 Document Reviewed: 05/11/2018 Elsevier Patient Education  2021 Elsevier Inc.   Round Ligament  Pain  The round ligament is a cord of muscle and tissue that helps support the uterus. It can become a source of pain during pregnancy if it becomes stretched or twisted as the baby grows. The pain usually begins in the second trimester (13-28 weeks) of pregnancy, and it can come and go until the baby is delivered. It is not a serious problem, and it does not cause harm to the baby. Round ligament pain is usually a short, sharp, and pinching pain, but it can also be a dull, lingering, and aching pain. The pain is felt in the lower side of the abdomen or in the groin. It usually starts deep in the groin and moves up to the outside of the hip area. The pain  may occur when you:  Suddenly change position, such as quickly going from a sitting to standing position.  Roll over in bed.  Cough or sneeze.  Do physical activity. Follow these instructions at home:  Watch your condition for any changes.  When the pain starts, relax. Then try any of these methods to help with the pain: ? Sitting down. ? Flexing your knees up to your abdomen. ? Lying on your side with one pillow under your abdomen and another pillow between your legs. ? Sitting in a warm bath for 15-20 minutes or until the pain goes away.  Take over-the-counter and prescription medicines only as told by your health care provider.  Move slowly when you sit down or stand up.  Avoid long walks if they cause pain.  Stop or reduce your physical activities if they cause pain.  Keep all follow-up visits as told by your health care provider. This is important.   Contact a health care provider if:  Your pain does not go away with treatment.  You feel pain in your back that you did not have before.  Your medicine is not helping. Get help right away if:  You have a fever or chills.  You develop uterine contractions.  You have vaginal bleeding.  You have nausea or vomiting.  You have diarrhea.  You have pain when you  urinate. Summary  Round ligament pain is felt in the lower abdomen or groin. It is usually a short, sharp, and pinching pain. It can also be a dull, lingering, and aching pain.  This pain usually begins in the second trimester (13-28 weeks). It occurs because the uterus is stretching with the growing baby, and it is not harmful to the baby.  You may notice the pain when you suddenly change position, when you cough or sneeze, or during physical activity.  Relaxing, flexing your knees to your abdomen, lying on one side, or taking a warm bath may help to get rid of the pain.  Get help from your health care provider if the pain does not go away or if you have vaginal bleeding, nausea, vomiting, diarrhea, or painful urination. This information is not intended to replace advice given to you by your health care provider. Make sure you discuss any questions you have with your health care provider. Document Revised: 10/08/2017 Document Reviewed: 10/08/2017 Elsevier Patient Education  2021 Elsevier Inc.   Back Pain in Pregnancy Back pain during pregnancy is common. Back pain may be caused by several factors that are related to changes during your pregnancy. Follow these instructions at home: Managing pain, stiffness, and swelling  If directed, for sudden (acute) back pain, put ice on the painful area. ? Put ice in a plastic bag. ? Place a towel between your skin and the bag. ? Leave the ice on for 20 minutes, 2-3 times per day.  If directed, apply heat to the affected area before you exercise. Use the heat source that your health care provider recommends, such as a moist heat pack or a heating pad. ? Place a towel between your skin and the heat source. ? Leave the heat on for 20-30 minutes. ? Remove the heat if your skin turns bright red. This is especially important if you are unable to feel pain, heat, or cold. You may have a greater risk of getting burned.  If directed, massage the affected  area.      Activity  Exercise as told by your health care  provider. Gentle exercise is the best way to prevent or manage back pain.  Listen to your body when lifting. If lifting hurts, ask for help or bend your knees. This uses your leg muscles instead of your back muscles.  Squat down when picking up something from the floor. Do not bend over.  Only use bed rest for short periods as told by your health care provider. Bed rest should only be used for the most severe episodes of back pain. Standing, sitting, and lying down  Do not stand in one place for long periods of time.  Use good posture when sitting. Make sure your head rests over your shoulders and is not hanging forward. Use a pillow on your lower back if necessary.  Try sleeping on your side, preferably the left side, with a pregnancy support pillow or 1-2 regular pillows between your legs. ? If you have back pain after a night's rest, your bed may be too soft. ? A firm mattress may provide more support for your back during pregnancy. General instructions  Do not wear high heels.  Eat a healthy diet. Try to gain weight within your health care provider's recommendations.  Use a maternity girdle, elastic sling, or back brace as told by your health care provider.  Take over-the-counter and prescription medicines only as told by your health care provider.  Work with a physical therapist or massage therapist to find ways to manage back pain. Acupuncture or massage therapy may be helpful.  Keep all follow-up visits as told by your health care provider. This is important. Contact a health care provider if:  Your back pain interferes with your daily activities.  You have increasing pain in other parts of your body. Get help right away if:  You develop numbness, tingling, weakness, or problems with the use of your arms or legs.  You develop severe back pain that is not controlled with medicine.  You have a change in bowel  or bladder control.  You develop shortness of breath, dizziness, or you faint.  You develop nausea, vomiting, or sweating.  You have back pain that is a rhythmic, cramping pain similar to labor pains. Labor pain is usually 1-2 minutes apart, lasts for about 1 minute, and involves a bearing down feeling or pressure in your pelvis.  You have back pain and your water breaks or you have vaginal bleeding.  You have back pain or numbness that travels down your leg.  Your back pain developed after you fell.  You develop pain on one side of your back.  You see blood in your urine.  You develop skin blisters in the area of your back pain. Summary  Back pain may be caused by several factors that are related to changes during your pregnancy.  Follow instructions as told by your health care provider for managing pain, stiffness, and swelling.  Exercise as told by your health care provider. Gentle exercise is the best way to prevent or manage back pain.  Take over-the-counter and prescription medicines only as told by your health care provider.  Keep all follow-up visits as told by your health care provider. This is important. This information is not intended to replace advice given to you by your health care provider. Make sure you discuss any questions you have with your health care provider. Document Revised: 08/11/2018 Document Reviewed: 10/08/2017 Elsevier Patient Education  2021 [employer].   Third Trimester of Pregnancy  The third trimester of pregnancy is from week  28 through week 40. This is also called months 7 through 9. This trimester is when your unborn baby (fetus) is growing very fast. At the end of the ninth month, the unborn baby is about 20 inches long. It weighs about 6-10 pounds. Body changes during your third trimester Your body continues to go through many changes during this time. The changes vary and generally return to normal after the baby is born. Physical  changes  Your weight will continue to increase. You may gain 25-35 pounds (11-16 kg) by the end of the pregnancy. If you are underweight, you may gain 28-40 lb (about 13-18 kg). If you are overweight, you may gain 15-25 lb (about 7-11 kg).  You may start to get stretch marks on your hips, belly (abdomen), and breasts.  Your breasts will continue to grow and may hurt. A yellow fluid (colostrum) may leak from your breasts. This is the first milk you are making for your baby.  You may have changes in your hair.  Your belly button may stick out.  You may have more swelling in your hands, face, or ankles. Health changes  You may have heartburn.  You may have trouble pooping (constipation).  You may get hemorrhoids. These are swollen veins in the butt that can itch or get painful.  You may have swollen veins (varicose veins) in your legs.  You may have more body aches in the pelvis, back, or thighs.  You may have more tingling or numbness in your hands, arms, and legs. The skin on your belly may also feel numb.  You may feel short of breath as your womb (uterus) gets bigger. Other changes  You may pee (urinate) more often.  You may have more problems sleeping.  You may notice the unborn baby "dropping," or moving lower in your belly.  You may have more discharge coming from your vagina.  Your joints may feel loose, and you may have pain around your pelvic bone. Follow these instructions at home: Medicines  Take over-the-counter and prescription medicines only as told by your doctor. Some medicines are not safe during pregnancy.  Take a prenatal vitamin that contains at least 600 micrograms (mcg) of folic acid. Eating and drinking  Eat healthy meals that include: ? Fresh fruits and vegetables. ? Whole grains. ? Good sources of protein, such as meat, eggs, or tofu. ? Low-fat dairy products.  Avoid raw meat and unpasteurized juice, milk, and cheese. These carry germs that  can harm you and your baby.  Eat 4 or 5 small meals rather than 3 large meals a day.  You may need to take these actions to prevent or treat trouble pooping: ? Drink enough fluids to keep your pee (urine) pale yellow. ? Eat foods that are high in fiber. These include beans, whole grains, and fresh fruits and vegetables. ? Limit foods that are high in fat and sugar. These include fried or sweet foods. Activity  Exercise only as told by your doctor. Stop exercising if you start to have cramps in your womb.  Avoid heavy lifting.  Do not exercise if it is too hot or too humid, or if you are in a place of great height (high altitude).  If you choose to, you may have sex unless your doctor tells you not to. Relieving pain and discomfort  Take breaks often, and rest with your legs raised (elevated) if you have leg cramps or low back pain.  Take warm water baths (sitz  baths) to soothe pain or discomfort caused by hemorrhoids. Use hemorrhoid cream if your doctor approves.  Wear a good support bra if your breasts are tender.  If you develop bulging, swollen veins in your legs: ? Wear support hose as told by your doctor. ? Raise your feet for 15 minutes, 3-4 times a day. ? Limit salt in your food. Safety  Talk to your doctor before traveling far distances.  Do not use hot tubs, steam rooms, or saunas.  Wear your seat belt at all times when you are in a car.  Talk with your doctor if someone is hurting you or yelling at you a lot. Preparing for your baby's arrival To prepare for the arrival of your baby:  Take prenatal classes.  Visit the hospital and tour the maternity area.  Buy a rear-facing car seat. Learn how to install it in your car.  Prepare the baby's room. Take out all pillows and stuffed animals from the baby's crib. General instructions  Avoid cat litter boxes and soil used by cats. These carry germs that can cause harm to the baby and can cause a loss of your baby  by miscarriage or stillbirth.  Do not douche or use tampons. Do not use scented sanitary pads.  Do not smoke or use any products that contain nicotine or tobacco. If you need help quitting, ask your doctor.  Do not drink alcohol.  Do not use herbal medicines, illegal drugs, or medicines that were not approved by your doctor. Chemicals in these products can affect your baby.  Keep all follow-up visits. This is important. Where to find more information  American Pregnancy Association: americanpregnancy.org  [employer]nec[employer]: www.acog.org  Office on Women's Health: [employer].gov/pregnancy Contact a doctor if:  You have a fever.  You have mild cramps or pressure in your lower belly.  You have a nagging pain in your belly area.  You vomit, or you have watery poop (diarrhea).  You have bad-smelling fluid coming from your vagina.  You have pain when you pee, or your pee smells bad.  You have a headache that does not go away when you take medicine.  You have changes in how you see, or you see spots in front of your eyes. Get help right away if:  Your water breaks.  You have regular contractions that are less than 5 minutes apart.  You are spotting or bleeding from your vagina.  You have very bad belly cramps or pain.  You have trouble breathing.  You have chest pain.  You faint.  You have not felt the baby move for the amount of time told by your doctor.  You have new or increased pain, swelling, or redness in an arm or leg. Summary  The third trimester is from week 28 through week 40 (months 7 through 9). This is the time when your unborn baby is growing very fast.  During this time, your discomfort may increase as you gain weight and as your baby grows.  Get ready for your baby to arrive by taking prenatal classes, buying a rear-facing car seat, and preparing the baby's room.  Get help right away if you are bleeding from  your vagina, you have chest pain and trouble breathing, or you have not felt the baby move for the amount of time told by your doctor. This information is not intended to replace advice given to you by your health care provider. Make sure you discuss any  questions you have with your health care provider. Document Revised: 09/29/2019 Document Reviewed: 08/05/2019 Elsevier Patient Education  2021 [employer].

## 2020-09-04 NOTE — Progress Notes (Signed)
I have seen, interviewed, and examined the patient in conjunction with the [facility] [employer] [facility] and affirm the diagnosis and management plan.   Gunnar Bulla, CNM [facility], [facility] 09/04/20 4:44 PM

## 2020-09-15 ENCOUNTER — Other Ambulatory Visit: Payer: Self-pay

## 2020-09-15 ENCOUNTER — Ambulatory Visit (INDEPENDENT_AMBULATORY_CARE_PROVIDER_SITE_OTHER): Payer: Managed Care, Other (non HMO) | Admitting: Obstetrics and Gynecology

## 2020-09-15 ENCOUNTER — Encounter: Payer: Self-pay | Admitting: Obstetrics and Gynecology

## 2020-09-15 VITALS — BP 122/80 | HR 108 | Wt 210.9 lb

## 2020-09-15 DIAGNOSIS — O09299 Supervision of pregnancy with other poor reproductive or obstetric history, unspecified trimester: Secondary | ICD-10-CM

## 2020-09-15 DIAGNOSIS — O34219 Maternal care for unspecified type scar from previous cesarean delivery: Secondary | ICD-10-CM

## 2020-09-15 DIAGNOSIS — Z3A34 34 weeks gestation of pregnancy: Secondary | ICD-10-CM

## 2020-09-15 DIAGNOSIS — N949 Unspecified condition associated with female genital organs and menstrual cycle: Secondary | ICD-10-CM

## 2020-09-15 DIAGNOSIS — O99891 Other specified diseases and conditions complicating pregnancy: Secondary | ICD-10-CM

## 2020-09-15 DIAGNOSIS — Z8751 Personal history of pre-term labor: Secondary | ICD-10-CM

## 2020-09-15 DIAGNOSIS — Z98891 History of uterine scar from previous surgery: Secondary | ICD-10-CM

## 2020-09-15 DIAGNOSIS — M549 Dorsalgia, unspecified: Secondary | ICD-10-CM

## 2020-09-15 DIAGNOSIS — O0993 Supervision of high risk pregnancy, unspecified, third trimester: Secondary | ICD-10-CM

## 2020-09-15 LAB — POCT URINALYSIS DIPSTICK OB
Bilirubin, UA: NEGATIVE
Blood, UA: NEGATIVE
Glucose, UA: NEGATIVE
Ketones, UA: NEGATIVE
Leukocytes, UA: NEGATIVE
Nitrite, UA: NEGATIVE
POC,PROTEIN,UA: NEGATIVE
Spec Grav, UA: 1.01 (ref 1.010–1.025)
Urobilinogen, UA: 0.2 E.U./dL
pH, UA: 7 (ref 5.0–8.0)

## 2020-09-15 NOTE — Progress Notes (Signed)
OB-Pt present for OB visit and to discuss VBAC. Pt c/o round ligament pain and abd cramping.

## 2020-09-15 NOTE — Progress Notes (Signed)
Consult ROB:   Anita Hurst is a  1974 at [redacted]w[redacted]d with Estimated Date of Delivery: 10/21/20 was seen today in office to discuss trial of labor after cesarean section (TOLAC) versus elective repeat cesarean delivery (ERCD). Previous C-section due to NRFHT in labor, preterm delivery at 32 weeks secondary to HELLP syndrome. The following risks were discussed with the patient.  Risk of uterine rupture at term is 0.78 percent with TOLAC and 0.22 percent with ERCD. 1 in 10 uterine ruptures will result in neonatal death or neurological injury. The benefits of a trial of labor after cesarean (TOLAC) resulting in a vaginal birth after cesarean (VBAC) include the following: shorter length of hospital stay and postpartum recovery (in most cases); fewer complications, such as postpartum fever, wound or uterine infection, thromboembolism (blood clots in the leg or lung), need for blood transfusion and fewer neonatal breathing problems. The risks of an attempted VBAC or TOLAC include the following: . Risk of failed trial of labor after cesarean (TOLAC) without a vaginal birth after cesarean (VBAC) resulting in repeat cesarean delivery (RCD) in about 20 to 40 percent of women who attempt VBAC.  Marland Kitchen Risk of rupture of uterus resulting in an emergency cesarean delivery. The risk of uterine rupture may be related in part to the type of uterine incision made during the first cesarean delivery. A previous transverse uterine incision has the lowest risk of rupture (0.2 to 1.5 percent risk). Vertical or T-shaped uterine incisions have a higher risk of uterine rupture (4 to 9 percent risk)The risk of fetal death is very low with both VBAC and elective repeat cesarean delivery (ERCD), but the likelihood of fetal death is higher with VBAC than with ERCD. Maternal death is very rare with either type of delivery. The risks of an elective repeat cesarean delivery (ERCD) were reviewed with the patient including but not limited  to: 06/998 risk of uterine rupture which could have serious consequences, bleeding which may require transfusion; infection which may require antibiotics; injury to bowel, bladder or other surrounding organs (bowel, bladder, ureters); injury to the fetus; need for additional procedures including hysterectomy in the event of a life-threatening hemorrhage; thromboembolic phenomenon; abnormal placentation; incisional problems; death and other postoperative or anesthesia complications.    These risks and benefits are summarized on the consent form, which was reviewed with the patient during the visit.  All her questions answered and she signed a consent indicating a preference for TOLAC.  Her VBAC calculated score is 64.5%.      She also complains of back pain and round ligament pain, knows this is normal.  Is using warm baths which help. Also trying to work on stretching for sciatic pain. Tried belly band but notes skin irritation. continue routine postpartum care.

## 2020-09-15 NOTE — Patient Instructions (Addendum)
Vaginal Birth After Cesarean Delivery  Vaginal birth after cesarean delivery (VBAC) is giving birth vaginally after previously delivering a baby through a cesarean section (C-section). A VBAC may be a safe option for you, depending on your health and other factors. It is important to discuss VBAC with your health care provider early in your pregnancy so you can understand the risks, benefits, and options. Having these discussions early will give you time to make your birth plan. Who are the best candidates for VBAC? The best candidates for VBAC are women who:  Have had one or two prior cesarean deliveries, and the incision made during the delivery was horizontal (low transverse).  Do not have a vertical (classical) scar on their uterus.  Have not had a tear in the wall of their uterus (uterine rupture).  Plan to have more pregnancies. A VBAC is also more likely to be successful:  In women who have previously given birth vaginally.  When labor starts by itself (spontaneously) before the due date. What are the benefits of VBAC? The benefits of delivering your baby vaginally instead of by a cesarean delivery include:  A shorter hospital stay.  A faster recovery time.  Less pain.  Avoiding risks associated with major surgery, such as infection and blood clots.  Less blood loss and less need for donated blood (transfusions). What are the risks of VBAC? The main risk of attempting a VBAC is that it may fail, forcing your health care provider to deliver your baby by a C-section. Other risks are rare and include:  Tearing (rupture) of the scar from a past cesarean delivery.  Other risks associated with vaginal deliveries. If a repeat cesarean delivery is needed, the risks include:  Blood loss.  Infection.  Blood clot.  Damage to surrounding organs.  Removal of the uterus (hysterectomy), if it is damaged.  Placenta problems in future pregnancies. What else should I know  about my options? Delivering a baby through a VBAC is similar to having a normal spontaneous vaginal delivery. Therefore, it is safe:  To try with twins.  For your health care provider to try to turn the baby from a breech position (external cephalic version) during labor.  With epidural analgesia for pain relief. Consider where you would like to deliver your baby. VBAC should be attempted in facilities where an emergency cesarean delivery can be performed. VBAC is not recommended for home births. Any changes in your health or your baby's health during your pregnancy may make it necessary to change your initial decision about VBAC. Your health care provider may recommend that you do not attempt a VBAC if:  Your baby's suspected weight is 8.8 lb (4 kg) or more.  You have preeclampsia. This is a condition that causes high blood pressure along with other symptoms, such as swelling and headaches.  You will have VBAC less than 19 months after your cesarean delivery.  You are past your due date.  You need to have labor started (induced) because your cervix is not ready for labor (unfavorable). Where to find more information  American Pregnancy Association: americanpregnancy.org  American Congress of Obstetricians and Gynecologists: acog.org Summary  Vaginal birth after cesarean delivery (VBAC) is giving birth vaginally after previously delivering a baby through a cesarean section (C-section). A VBAC may be a safe option for you, depending on your health and other factors.  Discuss VBAC with your health care provider early in your pregnancy so you can understand the risks, benefits, options, and   have plenty of time to make your birth plan.  The main risk of attempting a VBAC is that it may fail, forcing your health care provider to deliver your baby by a C-section. Other risks are rare. This information is not intended to replace advice given to you by your health care provider. Make sure  you discuss any questions you have with your health care provider. Document Revised: 08/18/2018 Document Reviewed: 07/30/2016 Elsevier Patient Education  2021 [employer].   Preparing for Vaginal Birth After Cesarean Delivery Vaginal birth after cesarean delivery (VBAC) is giving birth vaginally after previously delivering a baby through a cesarean section (C-section). You and your health are provider will discuss your options and whether you may be a good candidate for VBAC. What are my options? After a cesarean delivery, your options for future deliveries may include:  Scheduled repeat cesarean delivery. This is done in a hospital with an operating room.  Trial of labor after cesarean (TOLAC). A successful TOLAC results in a vaginal delivery. If it is not successful, you will need to have a cesarean delivery. TOLAC should be attempted in facilities where an emergency cesarean delivery can be performed. It should not be done as a home birth. Talk with your health care provider about the risks and benefits of each option early in your pregnancy. The best option for you will depend on your preferences and your overall health as well as your baby's. What should I know about my past cesarean delivery? It is important to know what type of incision was made in your uterus in a past cesarean delivery. The type of incision can affect the success of your TOLAC. Types of incisions include:  Low transverse. This is a side-to-side cut low on your uterus. The scar on your skin looks like a horizontal line just above your pubic area. This type of cut is the most common and makes you a good candidate for TOLAC.  Low vertical. This is an up-and-down cut low on your uterus. The scar on your skin looks like a vertical line between your pubic area and belly button. This type of cut puts you at higher risk for problems during TOLAC.  High vertical or classical. This is an up-and-down cut high on your uterus. The  scar on your skin looks like a vertical line that runs over the top of your belly button. This type of cut has the highest risk for problems and usually means that TOLAC is not an option.   When is VBAC not an option? As you progress through your pregnancy, circumstances may change and you may need to reconsider your options. Your situation may also change even as you begin TOLAC. Your health care provider may not want you to attempt a VBAC if you:  Need to have labor started (induced) because your cervix is not ready for labor.  Have never had a vaginal delivery.  Have had more than two cesarean deliveries.  Are overdue.  Are pregnant with a very large baby.  Have a condition that causes high blood pressure (preeclampsia). Questions to ask your health care provider  Am I a good candidate for TOLAC?  What are my chances of a successful vaginal delivery?  Is my preferred birth location equipped for a TOLAC?  What are my pain management options during a TOLAC? Where to find more information  American Congress of Obstetricians and Gynecologists: www.acog.org  [employer]-Midwives: www.midwife.org Summary  Vaginal birth after cesarean delivery (  VBAC) is giving birth vaginally after previously delivering a baby through a cesarean section (C-section).  VBAC may be a safe and appropriate option for you depending on your medical history and other risk factors. Talk with your health care provider about the options available to you, and the risks and benefits of each early in your pregnancy.  TOLAC should be attempted in facilities where emergency cesarean section procedures can be performed. This information is not intended to replace advice given to you by your health care provider. Make sure you discuss any questions you have with your health care provider. Document Revised: 08/18/2018 Document Reviewed: 08/01/2016 Elsevier Patient Education  2021 [employer].

## 2020-09-18 ENCOUNTER — Encounter: Payer: Managed Care, Other (non HMO) | Admitting: Certified Nurse Midwife

## 2020-09-21 ENCOUNTER — Ambulatory Visit (INDEPENDENT_AMBULATORY_CARE_PROVIDER_SITE_OTHER): Payer: Managed Care, Other (non HMO) | Admitting: Certified Nurse Midwife

## 2020-09-21 ENCOUNTER — Encounter: Payer: Self-pay | Admitting: Certified Nurse Midwife

## 2020-09-21 ENCOUNTER — Other Ambulatory Visit: Payer: Self-pay

## 2020-09-21 VITALS — BP 126/81 | HR 105 | Wt 210.2 lb

## 2020-09-21 DIAGNOSIS — O0993 Supervision of high risk pregnancy, unspecified, third trimester: Secondary | ICD-10-CM

## 2020-09-21 DIAGNOSIS — O26893 Other specified pregnancy related conditions, third trimester: Secondary | ICD-10-CM

## 2020-09-21 DIAGNOSIS — R519 Headache, unspecified: Secondary | ICD-10-CM

## 2020-09-21 DIAGNOSIS — H43393 Other vitreous opacities, bilateral: Secondary | ICD-10-CM

## 2020-09-21 DIAGNOSIS — Z3A35 35 weeks gestation of pregnancy: Secondary | ICD-10-CM

## 2020-09-21 DIAGNOSIS — Z87898 Personal history of other specified conditions: Secondary | ICD-10-CM

## 2020-09-21 DIAGNOSIS — O219 Vomiting of pregnancy, unspecified: Secondary | ICD-10-CM

## 2020-09-21 DIAGNOSIS — O09293 Supervision of pregnancy with other poor reproductive or obstetric history, third trimester: Secondary | ICD-10-CM

## 2020-09-21 LAB — POCT URINALYSIS DIPSTICK OB
Bilirubin, UA: NEGATIVE
Blood, UA: NEGATIVE
Glucose, UA: NEGATIVE
Ketones, UA: NEGATIVE
Leukocytes, UA: NEGATIVE
Nitrite, UA: NEGATIVE
POC,PROTEIN,UA: NEGATIVE
Spec Grav, UA: 1.01 (ref 1.010–1.025)
Urobilinogen, UA: 0.2 E.U./dL
pH, UA: 7.5 (ref 5.0–8.0)

## 2020-09-21 MED ORDER — ONDANSETRON 4 MG PO TBDP: 4.0000 mg | ORAL_TABLET | Freq: Four times a day (QID) | ORAL | 0 refills | Status: DC | PRN

## 2020-09-21 MED ORDER — MAGNESIUM OXIDE -MG SUPPLEMENT 420 (252 MG) MG PO TABS: 1.0000 | ORAL_TABLET | Freq: Two times a day (BID) | ORAL | 2 refills | Status: DC

## 2020-09-21 NOTE — Progress Notes (Signed)
ROB-Reports headache on Sunday that resolved with Tylenol, intermittent dizziness and floaters (history of vertigo), and multiple episode of vomiting since Tuesday night. Abdomen non-tender upon exam, negative epigastric pain. Labs today, see orders. Rx Zofran and Magnesium oxide, see orders. Anticipatory guidance regarding course of prenatal care. Reviewed red flag symptoms and when to call. RTC x 1 week for 36 week cultures and ROB with ANNIE; RTC x 2 weeks for ROB with JML or sooner if needed.

## 2020-09-21 NOTE — Progress Notes (Signed)
ROB: She continues to have floaters. She has concerns about vomiting x 3 days. She has been throwing up everything that she eats. Gets headaches from vomiting.

## 2020-09-21 NOTE — Patient Instructions (Signed)
Fetal Movement Counts Patient Name: ________________________________________________ Patient Due Date: ____________________  What is a fetal movement count? A fetal movement count is the number of times that you feel your baby move during a certain amount of time. This may also be called a fetal kick count. A fetal movement count is recommended for every pregnant woman. You may be asked to start counting fetal movements as early as week 28 of your pregnancy. Pay attention to when your baby is most active. You may notice your baby's sleep and wake cycles. You may also notice things that make your baby move more. You should do a fetal movement count:  When your baby is normally most active.  At the same time each day. A good time to count movements is while you are resting, after having something to eat and drink. How do I count fetal movements? 1. Find a quiet, comfortable area. Sit, or lie down on your side. 2. Write down the date, the start time and stop time, and the number of movements that you felt between those two times. Take this information with you to your health care visits. 3. Write down your start time when you feel the first movement. 4. Count kicks, flutters, swishes, rolls, and jabs. You should feel at least 10 movements. 5. You may stop counting after you have felt 10 movements, or if you have been counting for 2 hours. Write down the stop time. 6. If you do not feel 10 movements in 2 hours, contact your health care provider for further instructions. Your health care provider may want to do additional tests to assess your baby's well-being. Contact a health care provider if:  You feel fewer than 10 movements in 2 hours.  Your baby is not moving like he or she usually does. Date: ____________ Start time: ____________ Stop time: ____________ Movements: ____________ Date: ____________ Start time: ____________ Stop time: ____________ Movements: ____________ Date: ____________  Start time: ____________ Stop time: ____________ Movements: ____________ Date: ____________ Start time: ____________ Stop time: ____________ Movements: ____________ Date: ____________ Start time: ____________ Stop time: ____________ Movements: ____________ Date: ____________ Start time: ____________ Stop time: ____________ Movements: ____________ Date: ____________ Start time: ____________ Stop time: ____________ Movements: ____________ Date: ____________ Start time: ____________ Stop time: ____________ Movements: ____________ Date: ____________ Start time: ____________ Stop time: ____________ Movements: ____________ This information is not intended to replace advice given to you by your health care provider. Make sure you discuss any questions you have with your health care provider. Document Revised: 12/10/2018 Document Reviewed: 12/10/2018 Elsevier Patient Education  2021 [employer].   Common Medications Safe in Pregnancy  Acne:      Constipation:  Benzoyl Peroxide     Colace  Clindamycin      Dulcolax Suppository  Topica Erythromycin     Fibercon  Salicylic Acid      Metamucil         Miralax AVOID:        Senakot   Accutane    Cough:  Retin-A       Cough Drops  Tetracycline      Phenergan w/ Codeine if Rx  Minocycline      Robitussin (Plain & DM)  Antibiotics:     Crabs/Lice:  Ceclor       RID  Cephalosporins    AVOID:  E-Mycins      Kwell  Keflex  Macrobid/Macrodantin   Diarrhea:  Penicillin      Kao-Pectate  Zithromax  Imodium AD         PUSH FLUIDS AVOID:       Cipro     Fever:  Tetracycline      Tylenol (Regular or Extra  Minocycline       Strength)  Levaquin      Extra Strength-Do not          Exceed 8 tabs/24 hrs Caffeine:        <226m/day (equiv. To 1 cup of coffee or  approx. 3 12 oz  sodas)         Gas: Cold/Hayfever:       Gas-X  Benadryl      Mylicon  Claritin       Phazyme  **Claritin-D        Chlor-Trimeton    Headaches:  Dimetapp      ASA-Free Excedrin  Drixoral-Non-Drowsy     Cold Compress  Mucinex (Guaifenasin)     Tylenol (Regular or Extra  Sudafed/Sudafed-12 Hour     Strength)  **Sudafed PE Pseudoephedrine   Tylenol Cold & Sinus     Vicks Vapor Rub  Zyrtec  **AVOID if Problems With Blood Pressure         Heartburn: Avoid lying down for at least 1 hour after meals  Aciphex      Maalox     Rash:  Milk of Magnesia     Benadryl    Mylanta       1% Hydrocortisone Cream  Pepcid  Pepcid Complete   Sleep Aids:  Prevacid      Ambien   Prilosec       Benadryl  Rolaids       Chamomile Tea  Tums (Limit 4/day)     Unisom         Tylenol PM         Warm milk-add vanilla or  Hemorrhoids:       Sugar for taste  Anusol/Anusol H.C.  (RX: Analapram 2.5%)  Sugar Substitutes:  Hydrocortisone OTC     Ok in moderation  Preparation H      Tucks        Vaseline lotion applied to tissue with wiping    Herpes:     Throat:  Acyclovir      Oragel  Famvir  Valtrex     Vaccines:         Flu Shot Leg Cramps:       *Gardasil  Benadryl      Hepatitis A         Hepatitis B Nasal Spray:       Pneumovax  Saline Nasal Spray     Polio Booster         Tetanus Nausea:       Tuberculosis test or PPD  Vitamin B6 25 mg TID   AVOID:    Dramamine      *Gardasil  Emetrol       Live Poliovirus  Ginger Root 250 mg QID    MMR (measles, mumps &  High Complex Carbs @ Bedtime    rebella)  Sea Bands-Accupressure    Varicella (Chickenpox)  Unisom 1/2 tab TID     *No known complications           If received before Pain:         Known pregnancy;   Darvocet       Resume series after  Lortab        Delivery  Percocet  Yeast:   Tramadol      Femstat  Tylenol 3      Gyne-lotrimin  Ultram       Monistat  Vicodin           MISC:         All Sunscreens           Hair  Coloring/highlights          Insect Repellant's          (Including DEET)         Mystic Tans   Third Trimester of Pregnancy  The third trimester of pregnancy is from week 28 through week 40. This is also called months 7 through 9. This trimester is when your unborn baby (fetus) is growing very fast. At the end of the ninth month, the unborn baby is about 20 inches long. It weighs about 6-10 pounds. Body changes during your third trimester Your body continues to go through many changes during this time. The changes vary and generally return to normal after the baby is born. Physical changes  Your weight will continue to increase. You may gain 25-35 pounds (11-16 kg) by the end of the pregnancy. If you are underweight, you may gain 28-40 lb (about 13-18 kg). If you are overweight, you may gain 15-25 lb (about 7-11 kg).  You may start to get stretch marks on your hips, belly (abdomen), and breasts.  Your breasts will continue to grow and may hurt. A yellow fluid (colostrum) may leak from your breasts. This is the first milk you are making for your baby.  You may have changes in your hair.  Your belly button may stick out.  You may have more swelling in your hands, face, or ankles. Health changes  You may have heartburn.  You may have trouble pooping (constipation).  You may get hemorrhoids. These are swollen veins in the butt that can itch or get painful.  You may have swollen veins (varicose veins) in your legs.  You may have more body aches in the pelvis, back, or thighs.  You may have more tingling or numbness in your hands, arms, and legs. The skin on your belly may also feel numb.  You may feel short of breath as your womb (uterus) gets bigger. Other changes  You may pee (urinate) more often.  You may have more problems sleeping.  You may notice the unborn baby "dropping," or moving lower in your belly.  You may have more discharge coming from your vagina.  Your  joints may feel loose, and you may have pain around your pelvic bone. Follow these instructions at home: Medicines  Take over-the-counter and prescription medicines only as told by your doctor. Some medicines are not safe during pregnancy.  Take a prenatal vitamin that contains at least 600 micrograms (mcg) of folic acid. Eating and drinking  Eat healthy meals that include: ? Fresh fruits and vegetables. ? Whole grains. ? Good sources of protein, such as meat, eggs, or tofu. ? Low-fat dairy products.  Avoid raw meat and unpasteurized juice, milk, and cheese. These carry germs that can harm you and your baby.  Eat 4 or 5 small meals rather than 3 large meals a day.  You may need to take these actions to prevent or treat trouble pooping: ? Drink enough fluids to keep your pee (urine) pale yellow. ? Eat foods that are high in fiber. These include beans, whole grains, and fresh fruits and vegetables. ?  Limit foods that are high in fat and sugar. These include fried or sweet foods. Activity  Exercise only as told by your doctor. Stop exercising if you start to have cramps in your womb.  Avoid heavy lifting.  Do not exercise if it is too hot or too humid, or if you are in a place of great height (high altitude).  If you choose to, you may have sex unless your doctor tells you not to. Relieving pain and discomfort  Take breaks often, and rest with your legs raised (elevated) if you have leg cramps or low back pain.  Take warm water baths (sitz baths) to soothe pain or discomfort caused by hemorrhoids. Use hemorrhoid cream if your doctor approves.  Wear a good support bra if your breasts are tender.  If you develop bulging, swollen veins in your legs: ? Wear support hose as told by your doctor. ? Raise your feet for 15 minutes, 3-4 times a day. ? Limit salt in your food. Safety  Talk to your doctor before traveling far distances.  Do not use hot tubs, steam rooms, or  saunas.  Wear your seat belt at all times when you are in a car.  Talk with your doctor if someone is hurting you or yelling at you a lot. Preparing for your baby's arrival To prepare for the arrival of your baby:  Take prenatal classes.  Visit the hospital and tour the maternity area.  Buy a rear-facing car seat. Learn how to install it in your car.  Prepare the baby's room. Take out all pillows and stuffed animals from the baby's crib. General instructions  Avoid cat litter boxes and soil used by cats. These carry germs that can cause harm to the baby and can cause a loss of your baby by miscarriage or stillbirth.  Do not douche or use tampons. Do not use scented sanitary pads.  Do not smoke or use any products that contain nicotine or tobacco. If you need help quitting, ask your doctor.  Do not drink alcohol.  Do not use herbal medicines, illegal drugs, or medicines that were not approved by your doctor. Chemicals in these products can affect your baby.  Keep all follow-up visits. This is important. Where to find more information  American Pregnancy Association: americanpregnancy.org  [employer]: www.acog.org  Office on Women's Health: KeywordPortfolios.com.br Contact a doctor if:  You have a fever.  You have mild cramps or pressure in your lower belly.  You have a nagging pain in your belly area.  You vomit, or you have watery poop (diarrhea).  You have bad-smelling fluid coming from your vagina.  You have pain when you pee, or your pee smells bad.  You have a headache that does not go away when you take medicine.  You have changes in how you see, or you see spots in front of your eyes. Get help right away if:  Your water breaks.  You have regular contractions that are less than 5 minutes apart.  You are spotting or bleeding from your vagina.  You have very bad belly cramps or pain.  You have trouble  breathing.  You have chest pain.  You faint.  You have not felt the baby move for the amount of time told by your doctor.  You have new or increased pain, swelling, or redness in an arm or leg. Summary  The third trimester is from week 28 through week 40 (months 7 through  9). This is the time when your unborn baby is growing very fast.  During this time, your discomfort may increase as you gain weight and as your baby grows.  Get ready for your baby to arrive by taking prenatal classes, buying a rear-facing car seat, and preparing the baby's room.  Get help right away if you are bleeding from your vagina, you have chest pain and trouble breathing, or you have not felt the baby move for the amount of time told by your doctor. This information is not intended to replace advice given to you by your health care provider. Make sure you discuss any questions you have with your health care provider. Document Revised: 09/29/2019 Document Reviewed: 08/05/2019 Elsevier Patient Education  2021 [employer].   Preeclampsia and Eclampsia Preeclampsia is a serious condition that may develop during pregnancy. This condition involves high blood pressure during pregnancy and causes symptoms such as headaches, vision changes, and increased swelling in the legs, hands, and face. Preeclampsia occurs after 20 weeks of pregnancy. Eclampsia is a seizure that happens from worsening preeclampsia. Diagnosing and managing preeclampsia early is important. If not treated early, it can cause serious problems for mother and baby. There is no cure for this condition. However, during pregnancy, delivering the baby may be the best treatment for preeclampsia or eclampsia. For most women, symptoms of preeclampsia and eclampsia go away after giving birth. In rare cases, a woman may develop preeclampsia or eclampsia after giving birth. This usually occurs within 48 hours after childbirth but may occur up to 6 weeks after giving  birth. What are the causes? The cause of this condition is not known. What increases the risk? The following factors make you more likely to develop preeclampsia:  Being pregnant for the first time or being pregnant with multiples.  Having had preeclampsia or a condition called hemolysis, elevated liver enzymes, and low platelet count (HELLP)syndrome during a past pregnancy.  Having a family history of preeclampsia.  Being older than age 62.  Being obese.  Becoming pregnant through fertility treatments. Conditions that reduce blood flow or oxygen to your placenta and baby may also increase your risk. These include:  High blood pressure before, during, or immediately following pregnancy.  Kidney disease.  Diabetes.  Blood clotting disorders.  Autoimmune diseases, such as lupus.  Sleep apnea. What are the signs or symptoms? Common symptoms of this condition include:  A severe, throbbing headache that does not go away.  Vision problems, such as blurred or double vision and light sensitivity.  Pain in the stomach, especially the right upper region.  Pain in the shoulder. Other symptoms that may develop as the condition gets worse include:  Sudden weight gain because of fluid buildup in the body. This causes swelling of the face, hands, legs, and feet.  Severe nausea and vomiting.  Urinating less than usual.  Shortness of breath.  Seizures. How is this diagnosed? Your health care provider will ask you about symptoms and check for signs of preeclampsia during your prenatal visits. You will also have routine tests, including:  Checking your blood pressure.  Urine tests to check for protein.  Blood tests to assess your organ function.  Monitoring your baby's heart rate.  Ultrasounds to check fetal growth.   How is this treated? You and your health care provider will determine the treatment that is best for you. Treatment may include:  Frequent prenatal visits  to check for preeclampsia.  Medicine to lower your blood pressure.  Medicine to prevent seizures.  Low-dose aspirin during your pregnancy.  Staying in the hospital, in severe cases. You will be given medicines to control your blood pressure and the amount of fluids in your body.  Delivering your baby. Work with your health care provider to manage any chronic health conditions, such as diabetes or kidney problems. Also, work with your health care provider to manage weight gain during pregnancy. Follow these instructions at home: Eating and drinking  Drink enough fluid to keep your urine pale yellow.  Avoid caffeine. Caffeine may increase blood pressure and heart rate and lead to dehydration.  Reduce the amount of salt that you eat. Lifestyle  Do not use any products that contain nicotine or tobacco. These products include cigarettes, chewing tobacco, and vaping devices, such as e-cigarettes. If you need help quitting, ask your health care provider.  Do not use alcohol or drugs.  Avoid stress as much as possible.  Rest and get plenty of sleep. General instructions  Take over-the-counter and prescription medicines only as told by your health care provider.  When lying down, lie on your left side. This keeps pressure off your major blood vessels.  When sitting or lying down, raise (elevate) your feet. Try putting pillows underneath your lower legs.  Exercise regularly. Ask your health care provider what kinds of exercise are best for you.  Check your blood pressure as often as recommended by your health care provider.  Keep all prenatal and follow-up visits. This is important.   Contact a health care provider if:  You have symptoms that may need treatment or closer monitoring. These include: ? Headaches. ? Stomach pain or nausea and vomiting. ? Shoulder pain. ? Vision problems, such as spots in front of your eyes or blurry vision. ? Sudden weight gain or increased  swelling in your face, hands, legs, and feet. ? Increased anxiety or feeling of impending doom. ? Signs or symptoms of labor. Get help right away if:  You have any of the following symptoms: ? A seizure. ? Shortness of breath or trouble breathing. ? Trouble speaking or slurred speech. ? Fainting. ? Chest pain. These symptoms may represent a serious problem that is an emergency. Do not wait to see if the symptoms will go away. Get medical help right away. Call your local emergency services (911 in the [place] Do not drive yourself to the hospital. Summary  Preeclampsia is a serious condition that may develop during pregnancy.  Diagnosing and treating preeclampsia early is very important.  Keep all prenatal and follow-up visits. This is important.  Get help right away if you have a seizure, shortness of breath or trouble breathing, trouble speaking or slurred speech, chest pain, or fainting. This information is not intended to replace advice given to you by your health care provider. Make sure you discuss any questions you have with your health care provider. Document Revised: 01/13/2020 Document Reviewed: 01/13/2020 Elsevier Patient Education  2021 [place].   HELLP Syndrome  HELLP syndrome is a problem that affects pregnant women. In most cases, pregnant women have highblood pressure (hypertension) and protein in their urine (proteinuria) before they develop HELLP syndrome. HELLP syndrome usually develops near the end of a pregnancy, but it can also develop right after childbirth. The condition is life-threatening to both the mother and baby. The letters in HELLP stand for these problems:  H - Hemolytic anemia, hemolysis. This refers to the destruction of blood cells.  EL - Elevated liver enzymes. This  is a sign of liver damage.  LP - Low platelet count. Platelets are blood cells that help stop bleeding. What are the causes? The cause of this condition is not known. What  increases the risk? You are more likely to develop this condition if:  You are pregnant with more than one baby.  You have other medical conditions, such as diabetes or an autoimmune disease.  Any of these things happened during a previous pregnancy: ? You had preeclampsia or eclampsia. ? Your baby did not grow as expected. ? Your baby was born prematurely. ? Your placenta separated from your uterus (placental abruption). ? Your baby died before birth (stillbirth). What are the signs or symptoms? Symptoms of this condition include:  Pain in the upper right abdomen.  Pain in the shoulder, neck, and upper body.  Nausea and vomiting.  Feeling sick. Other symptoms may include:  Headache.  Visual disturbances.  Swelling of the hands, face, legs, and feet.  Sudden weight gain.  High blood pressure. How is this diagnosed? This condition may be diagnosed with blood and urine tests. These tests may include:  A complete blood count.  A liver or kidney function test.  A measurement of the salts and other chemicals in your body (electrolytes).  A blood coagulation test.  A measurement of protein in the urine. You may also have other tests, including:  Checking your blood pressure.  Ultrasounds.  Monitoring of your baby's heart rate. How is this treated? This condition is treated by delivering the baby as soon as possible. You may be given medicines to start contractions (induce labor) so that the baby can be delivered vaginally, or you may have a cesarean delivery. Additional treatment may include:  Receiving an infusion of magnesium sulfate before delivery. Magnesium sulfate is a medicine that helps prevent seizures.  Receiving medicines to lower and control blood pressure. These may be given if your blood pressure is too high.  Receiving steroid hormones (corticosteroids) to help your baby's lungs mature faster. During your treatment, you and your baby will be  monitored and your conditions will be managed. How is this prevented? If you are at risk for preeclampsia, after 12 weeks of pregnancy your health care provider may recommend that you take one low-dose aspirin (a dose of 81 mg) each day during your pregnancy. Where to find more information  Office on Women's Health: [place]Signs.tn  [employer]: www.acog.org Contact a health care provider if:  You gain more weight than expected.  You have headaches.  You have nausea or vomiting.  You have abdominal pain.  You feel dizzy or light-headed. Get help right away if:  You have a seizure.  You develop chest pain or shortness of breath.  You have changes in your ability to speak or move your body. These symptoms may represent a serious problem that is an emergency. Do not wait to see if the symptoms will go away. Get medical help right away. Call your local emergency services (911 in the [place] Do not drive yourself to the hospital. Summary  HELLP syndrome is a problem that affects pregnant women.  HELLP syndrome usually develops during the end of a pregnancy, but it can also develop right after childbirth.  This condition is treated by delivering the baby as soon as possible.  During your treatment, you and your baby will be monitored and your conditions will be managed.  Contact your health care provider if you gain more  weight than expected, feel dizzy or light-headed, or have headaches, nausea, vomiting, or abdominal pain. This information is not intended to replace advice given to you by your health care provider. Make sure you discuss any questions you have with your health care provider. Document Revised: 01/13/2020 Document Reviewed: 01/13/2020 Elsevier Patient Education  2021 [employer].

## 2020-09-22 LAB — CBC
Hematocrit: 32.7 % — ABNORMAL LOW (ref 34.0–46.6)
Hemoglobin: 11 g/dL — ABNORMAL LOW (ref 11.1–15.9)
MCH: 29.5 pg (ref 26.6–33.0)
MCHC: 33.6 g/dL (ref 31.5–35.7)
MCV: 88 fL (ref 79–97)
Platelets: 280 10*3/uL (ref 150–450)
RBC: 3.73 x10E6/uL — ABNORMAL LOW (ref 3.77–5.28)
RDW: 12.2 % (ref 11.7–15.4)
WBC: 10.9 10*3/uL — ABNORMAL HIGH (ref 3.4–10.8)

## 2020-09-22 LAB — PROTEIN / CREATININE RATIO, URINE
Creatinine, Urine: 65.7 mg/dL
Protein, Ur: 10.1 mg/dL
Protein/Creat Ratio: 154 mg/g creat (ref 0–200)

## 2020-09-22 LAB — COMPREHENSIVE METABOLIC PANEL
ALT: 11 IU/L (ref 0–32)
AST: 12 IU/L (ref 0–40)
Albumin/Globulin Ratio: 1.3 (ref 1.2–2.2)
Albumin: 3.4 g/dL — ABNORMAL LOW (ref 3.9–5.0)
Alkaline Phosphatase: 90 IU/L (ref 44–121)
BUN/Creatinine Ratio: 9 (ref 9–23)
BUN: 5 mg/dL — ABNORMAL LOW (ref 6–20)
Bilirubin Total: <0.2 mg/dL (ref 0.0–1.2)
CO2: 20 mmol/L (ref 20–29)
Calcium: 9.4 mg/dL (ref 8.7–10.2)
Chloride: 103 mmol/L (ref 96–106)
Creatinine, Ser: 0.55 mg/dL — ABNORMAL LOW (ref 0.57–1.00)
Globulin, Total: 2.7 g/dL (ref 1.5–4.5)
Glucose: 110 mg/dL — ABNORMAL HIGH (ref 65–99)
Potassium: 3.5 mmol/L (ref 3.5–5.2)
Sodium: 138 mmol/L (ref 134–144)
Total Protein: 6.1 g/dL (ref 6.0–8.5)
eGFR: 131 mL/min/{1.73_m2} (ref >59)

## 2020-09-25 ENCOUNTER — Other Ambulatory Visit: Payer: Self-pay | Admitting: Certified Nurse Midwife

## 2020-09-25 MED ORDER — PROMETHAZINE HCL 12.5 MG PO TABS: 12.5000 mg | ORAL_TABLET | Freq: Four times a day (QID) | ORAL | 0 refills | Status: DC | PRN

## 2020-09-27 ENCOUNTER — Ambulatory Visit (INDEPENDENT_AMBULATORY_CARE_PROVIDER_SITE_OTHER): Payer: Managed Care, Other (non HMO) | Admitting: Certified Nurse Midwife

## 2020-09-27 ENCOUNTER — Encounter: Payer: Self-pay | Admitting: Certified Nurse Midwife

## 2020-09-27 ENCOUNTER — Other Ambulatory Visit: Payer: Self-pay

## 2020-09-27 VITALS — BP 135/85 | HR 112 | Wt 212.5 lb

## 2020-09-27 DIAGNOSIS — Z3403 Encounter for supervision of normal first pregnancy, third trimester: Secondary | ICD-10-CM

## 2020-09-27 DIAGNOSIS — Z3A36 36 weeks gestation of pregnancy: Secondary | ICD-10-CM

## 2020-09-27 LAB — POCT URINALYSIS DIPSTICK OB
Bilirubin, UA: NEGATIVE
Blood, UA: NEGATIVE
Glucose, UA: NEGATIVE
Ketones, UA: NEGATIVE
Leukocytes, UA: NEGATIVE
Nitrite, UA: NEGATIVE
Spec Grav, UA: 1.01 (ref 1.010–1.025)
Urobilinogen, UA: 0.2 E.U./dL
pH, UA: 7 (ref 5.0–8.0)

## 2020-09-27 NOTE — Progress Notes (Signed)
error 

## 2020-09-27 NOTE — Patient Instructions (Signed)
Preeclampsia and Eclampsia Preeclampsia is a serious condition that may develop during pregnancy. This condition involves high blood pressure during pregnancy and causes symptoms such as headaches, vision changes, and increased swelling in the legs, hands, and face. Preeclampsia occurs after 20 weeks of pregnancy. Eclampsia is a seizure that happens from worsening preeclampsia. Diagnosing and managing preeclampsia early is important. If not treated early, it can cause serious problems for mother and baby. There is no cure for this condition. However, during pregnancy, delivering the baby may be the best treatment for preeclampsia or eclampsia. For most women, symptoms of preeclampsia and eclampsia go away after giving birth. In rare cases, a woman may develop preeclampsia or eclampsia after giving birth. This usually occurs within 48 hours after childbirth but may occur up to 6 weeks after giving birth. What are the causes? The cause of this condition is not known. What increases the risk? The following factors make you more likely to develop preeclampsia:  Being pregnant for the first time or being pregnant with multiples.  Having had preeclampsia or a condition called hemolysis, elevated liver enzymes, and low platelet count (HELLP)syndrome during a past pregnancy.  Having a family history of preeclampsia.  Being older than age 35.  Being obese.  Becoming pregnant through fertility treatments. Conditions that reduce blood flow or oxygen to your placenta and baby may also increase your risk. These include:  High blood pressure before, during, or immediately following pregnancy.  Kidney disease.  Diabetes.  Blood clotting disorders.  Autoimmune diseases, such as lupus.  Sleep apnea. What are the signs or symptoms? Common symptoms of this condition include:  A severe, throbbing headache that does not go away.  Vision problems, such as blurred or double vision and light  sensitivity.  Pain in the stomach, especially the right upper region.  Pain in the shoulder. Other symptoms that may develop as the condition gets worse include:  Sudden weight gain because of fluid buildup in the body. This causes swelling of the face, hands, legs, and feet.  Severe nausea and vomiting.  Urinating less than usual.  Shortness of breath.  Seizures. How is this diagnosed? Your health care provider will ask you about symptoms and check for signs of preeclampsia during your prenatal visits. You will also have routine tests, including:  Checking your blood pressure.  Urine tests to check for protein.  Blood tests to assess your organ function.  Monitoring your baby's heart rate.  Ultrasounds to check fetal growth.   How is this treated? You and your health care provider will determine the treatment that is best for you. Treatment may include:  Frequent prenatal visits to check for preeclampsia.  Medicine to lower your blood pressure.  Medicine to prevent seizures.  Low-dose aspirin during your pregnancy.  Staying in the hospital, in severe cases. You will be given medicines to control your blood pressure and the amount of fluids in your body.  Delivering your baby. Work with your health care provider to manage any chronic health conditions, such as diabetes or kidney problems. Also, work with your health care provider to manage weight gain during pregnancy. Follow these instructions at home: Eating and drinking  Drink enough fluid to keep your urine pale yellow.  Avoid caffeine. Caffeine may increase blood pressure and heart rate and lead to dehydration.  Reduce the amount of salt that you eat. Lifestyle  Do not use any products that contain nicotine or tobacco. These products include cigarettes, chewing tobacco, and   vaping devices, such as e-cigarettes. If you need help quitting, ask your health care provider.  Do not use alcohol or drugs.  Avoid  stress as much as possible.  Rest and get plenty of sleep. General instructions  Take over-the-counter and prescription medicines only as told by your health care provider.  When lying down, lie on your left side. This keeps pressure off your major blood vessels.  When sitting or lying down, raise (elevate) your feet. Try putting pillows underneath your lower legs.  Exercise regularly. Ask your health care provider what kinds of exercise are best for you.  Check your blood pressure as often as recommended by your health care provider.  Keep all prenatal and follow-up visits. This is important.   Contact a health care provider if:  You have symptoms that may need treatment or closer monitoring. These include: ? Headaches. ? Stomach pain or nausea and vomiting. ? Shoulder pain. ? Vision problems, such as spots in front of your eyes or blurry vision. ? Sudden weight gain or increased swelling in your face, hands, legs, and feet. ? Increased anxiety or feeling of impending doom. ? Signs or symptoms of labor. Get help right away if:  You have any of the following symptoms: ? A seizure. ? Shortness of breath or trouble breathing. ? Trouble speaking or slurred speech. ? Fainting. ? Chest pain. These symptoms may represent a serious problem that is an emergency. Do not wait to see if the symptoms will go away. Get medical help right away. Call your local emergency services (911 in the [place] Do not drive yourself to the hospital. Summary  Preeclampsia is a serious condition that may develop during pregnancy.  Diagnosing and treating preeclampsia early is very important.  Keep all prenatal and follow-up visits. This is important.  Get help right away if you have a seizure, shortness of breath or trouble breathing, trouble speaking or slurred speech, chest pain, or fainting. This information is not intended to replace advice given to you by your health care provider. Make sure you  discuss any questions you have with your health care provider. Document Revised: 01/13/2020 Document Reviewed: 01/13/2020 Elsevier Patient Education  2021 Elsevier Inc.  

## 2020-09-27 NOTE — Progress Notes (Signed)
ROB: She has been having sharp vaginal pains 2-3 times daily x 1 week.

## 2020-09-27 NOTE — Progress Notes (Signed)
ROB doing well. Feels good movement. Has lots of vaginal pressure. Request SVE today. Continues to have headaches, has some swelling in hands , has has some visual changes Pre e labs collected today . Will follow up with results. GBS and cultures collected. Follow up 1 wk with Marcelino Duster for ROB.  Doreene Burke, CNM

## 2020-09-28 LAB — COMPREHENSIVE METABOLIC PANEL
ALT: 10 IU/L (ref 0–32)
AST: 15 IU/L (ref 0–40)
Albumin/Globulin Ratio: 1.7 (ref 1.2–2.2)
Albumin: 3.8 g/dL — ABNORMAL LOW (ref 3.9–5.0)
Alkaline Phosphatase: 101 IU/L (ref 44–121)
BUN/Creatinine Ratio: 13 (ref 9–23)
BUN: 10 mg/dL (ref 6–20)
Bilirubin Total: <0.2 mg/dL (ref 0.0–1.2)
CO2: 23 mmol/L (ref 20–29)
Calcium: 9.3 mg/dL (ref 8.7–10.2)
Chloride: 102 mmol/L (ref 96–106)
Creatinine, Ser: 0.8 mg/dL (ref 0.57–1.00)
Globulin, Total: 2.2 g/dL (ref 1.5–4.5)
Glucose: 85 mg/dL (ref 65–99)
Potassium: 4.4 mmol/L (ref 3.5–5.2)
Sodium: 138 mmol/L (ref 134–144)
Total Protein: 6 g/dL (ref 6.0–8.5)
eGFR: 105 mL/min/{1.73_m2} (ref >59)

## 2020-09-28 LAB — CBC WITH DIFFERENTIAL/PLATELET
Basophils Absolute: 0 10*3/uL (ref 0.0–0.2)
Basos: 0 %
EOS (ABSOLUTE): 0.1 10*3/uL (ref 0.0–0.4)
Eos: 1 %
Hematocrit: 31.4 % — ABNORMAL LOW (ref 34.0–46.6)
Hemoglobin: 10.8 g/dL — ABNORMAL LOW (ref 11.1–15.9)
Immature Grans (Abs): 0.1 10*3/uL (ref 0.0–0.1)
Immature Granulocytes: 1 %
Lymphocytes Absolute: 1.8 10*3/uL (ref 0.7–3.1)
Lymphs: 17 %
MCH: 29.8 pg (ref 26.6–33.0)
MCHC: 34.4 g/dL (ref 31.5–35.7)
MCV: 87 fL (ref 79–97)
Monocytes Absolute: 0.8 10*3/uL (ref 0.1–0.9)
Monocytes: 7 %
Neutrophils Absolute: 8.1 10*3/uL — ABNORMAL HIGH (ref 1.4–7.0)
Neutrophils: 74 %
Platelets: 305 10*3/uL (ref 150–450)
RBC: 3.62 x10E6/uL — ABNORMAL LOW (ref 3.77–5.28)
RDW: 11.9 % (ref 11.7–15.4)
WBC: 10.8 10*3/uL (ref 3.4–10.8)

## 2020-09-28 LAB — PROTEIN / CREATININE RATIO, URINE
Creatinine, Urine: 110.4 mg/dL
Protein, Ur: 14.9 mg/dL
Protein/Creat Ratio: 135 mg/g creat (ref 0–200)

## 2020-09-29 ENCOUNTER — Encounter: Payer: Self-pay | Admitting: Certified Nurse Midwife

## 2020-09-29 ENCOUNTER — Other Ambulatory Visit: Payer: Self-pay

## 2020-09-29 ENCOUNTER — Telehealth: Payer: Self-pay | Admitting: Certified Nurse Midwife

## 2020-09-29 ENCOUNTER — Observation Stay
Admission: EM | Admit: 2020-09-29 | Discharge: 2020-09-30 | Disposition: A | Discharge status: Home / Self Care | Payer: Managed Care, Other (non HMO) | Attending: Certified Nurse Midwife | Admitting: Certified Nurse Midwife

## 2020-09-29 DIAGNOSIS — O133 Gestational [pregnancy-induced] hypertension without significant proteinuria, third trimester: Secondary | ICD-10-CM | POA: Diagnosis not present

## 2020-09-29 DIAGNOSIS — Z3A37 37 weeks gestation of pregnancy: Secondary | ICD-10-CM | POA: Insufficient documentation

## 2020-09-29 DIAGNOSIS — Z20822 Contact with and (suspected) exposure to covid-19: Secondary | ICD-10-CM | POA: Insufficient documentation

## 2020-09-29 DIAGNOSIS — O163 Unspecified maternal hypertension, third trimester: Secondary | ICD-10-CM | POA: Diagnosis present

## 2020-09-29 DIAGNOSIS — O09299 Supervision of pregnancy with other poor reproductive or obstetric history, unspecified trimester: Secondary | ICD-10-CM

## 2020-09-29 LAB — COMPREHENSIVE METABOLIC PANEL
ALT: 14 U/L (ref 0–44)
AST: 16 U/L (ref 15–41)
Albumin: 2.9 g/dL — ABNORMAL LOW (ref 3.5–5.0)
Alkaline Phosphatase: 83 U/L (ref 38–126)
Anion gap: 8 (ref 5–15)
BUN: 8 mg/dL (ref 6–20)
CO2: 21 mmol/L — ABNORMAL LOW (ref 22–32)
Calcium: 8.5 mg/dL — ABNORMAL LOW (ref 8.9–10.3)
Chloride: 106 mmol/L (ref 98–111)
Creatinine, Ser: 0.81 mg/dL (ref 0.44–1.00)
GFR, Estimated: >60 mL/min (ref >60)
Glucose, Bld: 79 mg/dL (ref 70–99)
Potassium: 3.2 mmol/L — ABNORMAL LOW (ref 3.5–5.1)
Sodium: 135 mmol/L (ref 135–145)
Total Bilirubin: 0.4 mg/dL (ref 0.3–1.2)
Total Protein: 6.3 g/dL — ABNORMAL LOW (ref 6.5–8.1)

## 2020-09-29 LAB — RESP PANEL BY RT-PCR (FLU A&B, COVID) ARPGX2
Influenza A by PCR: NEGATIVE
Influenza B by PCR: NEGATIVE
SARS Coronavirus 2 by RT PCR: NEGATIVE

## 2020-09-29 LAB — CBC
HCT: 28.5 % — ABNORMAL LOW (ref 36.0–46.0)
Hemoglobin: 9.9 g/dL — ABNORMAL LOW (ref 12.0–15.0)
MCH: 30.1 pg (ref 26.0–34.0)
MCHC: 34.7 g/dL (ref 30.0–36.0)
MCV: 86.6 fL (ref 80.0–100.0)
Platelets: 283 10*3/uL (ref 150–400)
RBC: 3.29 MIL/uL — ABNORMAL LOW (ref 3.87–5.11)
RDW: 12.5 % (ref 11.5–15.5)
WBC: 11.9 10*3/uL — ABNORMAL HIGH (ref 4.0–10.5)
nRBC: 0 % (ref 0.0–0.2)

## 2020-09-29 LAB — PROTEIN / CREATININE RATIO, URINE
Creatinine, Urine: 127 mg/dL
Protein Creatinine Ratio: 0.1 mg/mg{Cre} (ref 0.00–0.15)
Total Protein, Urine: 13 mg/dL

## 2020-09-29 LAB — STREP GP B NAA: Strep Gp B NAA: NEGATIVE

## 2020-09-29 MED ORDER — LABETALOL HCL 5 MG/ML IV SOLN: 40.0000 mg | INTRAVENOUS | Status: DC | PRN

## 2020-09-29 MED ORDER — PROMETHAZINE HCL 25 MG RE SUPP
25.0000 mg | Freq: Four times a day (QID) | RECTAL | Status: DC | PRN
  Filled 2020-09-29: qty 1

## 2020-09-29 MED ORDER — METOCLOPRAMIDE HCL 5 MG/ML IJ SOLN
10.0000 mg | Freq: Four times a day (QID) | INTRAMUSCULAR | Status: DC | PRN
  Administered 2020-09-29: 10 mg via INTRAVENOUS
  Filled 2020-09-29 (×2): qty 2

## 2020-09-29 MED ORDER — FAMOTIDINE IN NACL 20-0.9 MG/50ML-% IV SOLN
20.0000 mg | INTRAVENOUS | Status: DC
  Administered 2020-09-30: 20 mg via INTRAVENOUS
  Filled 2020-09-29: qty 50

## 2020-09-29 MED ORDER — CALCIUM CARBONATE ANTACID 500 MG PO CHEW
200.0000 mg | CHEWABLE_TABLET | Freq: Two times a day (BID) | ORAL | Status: DC
  Administered 2020-09-29: 200 mg via ORAL
  Filled 2020-09-29: qty 1

## 2020-09-29 MED ORDER — SODIUM CHLORIDE 0.9 % IV SOLN: 12.5000 mg | Freq: Four times a day (QID) | INTRAVENOUS | Status: DC | PRN

## 2020-09-29 MED ORDER — NIFEDIPINE 10 MG PO CAPS: 20.0000 mg | ORAL_CAPSULE | ORAL | Status: DC | PRN

## 2020-09-29 MED ORDER — ONDANSETRON 4 MG PO TBDP
4.0000 mg | ORAL_TABLET | Freq: Four times a day (QID) | ORAL | Status: DC | PRN
  Administered 2020-09-29: 4 mg via ORAL
  Filled 2020-09-29 (×2): qty 1

## 2020-09-29 MED ORDER — LACTATED RINGERS IV BOLUS
1000.0000 mL | Freq: Once | INTRAVENOUS | Status: AC
  Administered 2020-09-29: 1000 mL via INTRAVENOUS

## 2020-09-29 MED ORDER — NIFEDIPINE 10 MG PO CAPS: 10.0000 mg | ORAL_CAPSULE | ORAL | Status: DC | PRN

## 2020-09-29 MED ORDER — BUTALBITAL-APAP-CAFFEINE 50-325-40 MG PO TABS: 1.0000 | ORAL_TABLET | Freq: Four times a day (QID) | ORAL | Status: DC | PRN

## 2020-09-29 MED ORDER — DEXTROSE IN LACTATED RINGERS 5 % IV SOLN: INTRAVENOUS | Status: DC

## 2020-09-29 MED ORDER — ZOLPIDEM TARTRATE 5 MG PO TABS
5.0000 mg | ORAL_TABLET | Freq: Every evening | ORAL | Status: DC | PRN
  Administered 2020-09-29: 5 mg via ORAL
  Filled 2020-09-29: qty 1

## 2020-09-29 NOTE — Telephone Encounter (Signed)
Telephone call to patient, verified full name and date of birth.   Reports vomiting, back and abdominal pain, elevated blood pressures and headache all day.   History of HELLP and preterm birth at 32 weeks.   Limited assessment. Referred to Labor and Delivery for further evaluation.   Report called to [facility], Estate manager/land agent.    Serafina Royals, CNM [facility], [facility] 09/29/20 3:18 PM

## 2020-09-29 NOTE — Telephone Encounter (Signed)
Anita Hurst called and stated she hasn't been feeling well all day.  Anita Hurst states she is throwing up, having headaches, and seeing stars.  She went to the fire station a few minutes ago and they took her blood pressure and the 1st reading was 180/90 and the 2nd reading was 160/90.  [place] would like to know what she needs to do.  Patient originally asked for Pattricia Boss but was informed Pattricia Boss was out until Tuesday so she requested I send this message to [place].  Please advise.

## 2020-09-30 DIAGNOSIS — O133 Gestational [pregnancy-induced] hypertension without significant proteinuria, third trimester: Secondary | ICD-10-CM | POA: Diagnosis not present

## 2020-09-30 DIAGNOSIS — O163 Unspecified maternal hypertension, third trimester: Secondary | ICD-10-CM | POA: Diagnosis not present

## 2020-09-30 LAB — COMPREHENSIVE METABOLIC PANEL
ALT: 13 U/L (ref 0–44)
AST: 16 U/L (ref 15–41)
Albumin: 2.6 g/dL — ABNORMAL LOW (ref 3.5–5.0)
Alkaline Phosphatase: 72 U/L (ref 38–126)
Anion gap: 8 (ref 5–15)
BUN: 5 mg/dL — ABNORMAL LOW (ref 6–20)
CO2: 22 mmol/L (ref 22–32)
Calcium: 8.1 mg/dL — ABNORMAL LOW (ref 8.9–10.3)
Chloride: 107 mmol/L (ref 98–111)
Creatinine, Ser: 0.58 mg/dL (ref 0.44–1.00)
GFR, Estimated: >60 mL/min (ref >60)
Glucose, Bld: 88 mg/dL (ref 70–99)
Potassium: 3.1 mmol/L — ABNORMAL LOW (ref 3.5–5.1)
Sodium: 137 mmol/L (ref 135–145)
Total Bilirubin: 0.3 mg/dL (ref 0.3–1.2)
Total Protein: 5.7 g/dL — ABNORMAL LOW (ref 6.5–8.1)

## 2020-09-30 LAB — CBC
HCT: 29.4 % — ABNORMAL LOW (ref 36.0–46.0)
Hemoglobin: 10 g/dL — ABNORMAL LOW (ref 12.0–15.0)
MCH: 29.9 pg (ref 26.0–34.0)
MCHC: 34 g/dL (ref 30.0–36.0)
MCV: 88 fL (ref 80.0–100.0)
Platelets: 236 10*3/uL (ref 150–400)
RBC: 3.34 MIL/uL — ABNORMAL LOW (ref 3.87–5.11)
RDW: 12.8 % (ref 11.5–15.5)
WBC: 9.1 10*3/uL (ref 4.0–10.5)
nRBC: 0 % (ref 0.0–0.2)

## 2020-09-30 LAB — GC/CHLAMYDIA PROBE AMP
Chlamydia trachomatis, NAA: NEGATIVE
Neisseria Gonorrhoeae by PCR: NEGATIVE

## 2020-09-30 LAB — PROTEIN / CREATININE RATIO, URINE
Creatinine, Urine: 26 mg/dL
Total Protein, Urine: <6 mg/dL

## 2020-09-30 MED ORDER — BUTALBITAL-APAP-CAFFEINE 50-325-40 MG PO TABS: 1.0000 | ORAL_TABLET | Freq: Four times a day (QID) | ORAL | 0 refills | Status: DC | PRN

## 2020-09-30 MED ORDER — CALCIUM CARBONATE ANTACID 500 MG PO CHEW: 200.0000 mg | CHEWABLE_TABLET | Freq: Four times a day (QID) | ORAL | 0 refills | Status: DC

## 2020-09-30 NOTE — Discharge Instructions (Signed)
Vaginal Delivery  Vaginal delivery means that you give birth by pushing your baby out of your birth canal (vagina). A team of health care providers will help you before, during, and after vaginal delivery. Birth experiences are unique for every woman and every pregnancy, and birth experiences vary depending on where you choose to give birth. What happens when I arrive at the birth center or hospital? Once you are in labor and have been admitted into the hospital or birth center, your health care provider may:  Review your pregnancy history and any concerns that you have.  Insert an IV into one of your veins. This may be used to give you fluids and medicines.  Check your blood pressure, pulse, temperature, and heart rate (vital signs).  Check whether your bag of water (amniotic sac) has broken (ruptured).  Talk with you about your birth plan and discuss pain control options. Monitoring Your health care provider may monitor your contractions (uterine monitoring) and your baby's heart rate (fetal monitoring). You may need to be monitored:  Often, but not continuously (intermittently).  All the time or for long periods at a time (continuously). Continuous monitoring may be needed if: ? You are taking certain medicines, such as medicine to relieve pain or make your contractions stronger. ? You have pregnancy or labor complications. Monitoring may be done by:  Placing a special stethoscope or a handheld monitoring device on your abdomen to check your baby's heartbeat and to check for contractions.  Placing monitors on your abdomen (external monitors) to record your baby's heartbeat and the frequency and length of contractions.  Placing monitors inside your uterus through your vagina (internal monitors) to record your baby's heartbeat and the frequency, length, and strength of your contractions. Depending on the type of monitor, it may remain in your uterus or on your baby's head until  birth.  Telemetry. This is a type of continuous monitoring that can be done with external or internal monitors. Instead of having to stay in bed, you are able to move around during telemetry. Physical exam Your health care provider may perform frequent physical exams. This may include:  Checking how and where your baby is positioned in your uterus.  Checking your cervix to determine: ? Whether it is thinning out (effacing). ? Whether it is opening up (dilating). What happens during labor and delivery? Normal labor and delivery is divided into the following three stages: Stage 1  This is the longest stage of labor.  This stage can last for hours or days.  Throughout this stage, you will feel contractions. Contractions generally feel mild, infrequent, and irregular at first. They get stronger, more frequent (about every 2-3 minutes), and more regular as you move through this stage.  This stage ends when your cervix is completely dilated to 4 inches (10 cm) and completely effaced. Stage 2  This stage starts once your cervix is completely effaced and dilated and lasts until the delivery of your baby.  This stage may last from 20 minutes to 2 hours.  This is the stage where you will feel an urge to push your baby out of your vagina.  You may feel stretching and burning pain, especially when the widest part of your baby's head passes through the vaginal opening (crowning).  Once your baby is delivered, the umbilical cord will be clamped and cut. This usually occurs after waiting a period of 1-2 minutes after delivery.  Your baby will be placed on your bare chest (skin-to-skin   contact) in an upright position and covered with a warm blanket. Watch your baby for feeding cues, like rooting or sucking, and help the baby to your breast for his or her first feeding. Stage 3  This stage starts immediately after the birth of your baby and ends after you deliver the placenta.  This stage may  take anywhere from 5 to 30 minutes.  After your baby has been delivered, you will feel contractions as your body expels the placenta and your uterus contracts to control bleeding.   What can I expect after labor and delivery?  After labor is over, you and your baby will be monitored closely until you are ready to go home to ensure that you are both healthy. Your health care team will teach you how to care for yourself and your baby.  You and your baby will stay in the same room (rooming in) during your hospital stay. This will encourage early bonding and successful breastfeeding.  You may continue to receive fluids and medicines through an IV.  Your uterus will be checked and massaged regularly (fundal massage).  You will have some soreness and pain in your abdomen, vagina, and the area of skin between your vaginal opening and your anus (perineum).  If an incision was made near your vagina (episiotomy) or if you had some vaginal tearing during delivery, cold compresses may be placed on your episiotomy or your tear. This helps to reduce pain and swelling.  You may be given a squirt bottle to use instead of wiping when you go to the bathroom. To use the squirt bottle, follow these steps: ? Before you urinate, fill the squirt bottle with warm water. Do not use hot water. ? After you urinate, while you are sitting on the toilet, use the squirt bottle to rinse the area around your urethra and vaginal opening. This rinses away any urine and blood. ? Fill the squirt bottle with clean water every time you use the bathroom.  It is normal to have vaginal bleeding after delivery. Wear a sanitary pad for vaginal bleeding and discharge. Summary  Vaginal delivery means that you will give birth by pushing your baby out of your birth canal (vagina).  Your health care provider may monitor your contractions (uterine monitoring) and your baby's heart rate (fetal monitoring).  Your health care provider may  perform a physical exam.  Normal labor and delivery is divided into three stages.  After labor is over, you and your baby will be monitored closely until you are ready to go home. This information is not intended to replace advice given to you by your health care provider. Make sure you discuss any questions you have with your health care provider. Document Revised: 05/27/2017 Document Reviewed: 05/27/2017 Elsevier Patient Education  2021 [employer]   Fetal Movement Counts Patient Name: ________________________________________________ Patient Due Date: ____________________  What is a fetal movement count? A fetal movement count is the number of times that you feel your baby move during a certain amount of time. This may also be called a fetal kick count. A fetal movement count is recommended for every pregnant woman. You may be asked to start counting fetal movements as early as week 28 of your pregnancy. Pay attention to when your baby is most active. You may notice your baby's sleep and wake cycles. You may also notice things that make your baby move more. You should do a fetal movement count:  When your baby is normally most  active.  At the same time each day. A good time to count movements is while you are resting, after having something to eat and drink. How do I count fetal movements? 1. Find a quiet, comfortable area. Sit, or lie down on your side. 2. Write down the date, the start time and stop time, and the number of movements that you felt between those two times. Take this information with you to your health care visits. 3. Write down your start time when you feel the first movement. 4. Count kicks, flutters, swishes, rolls, and jabs. You should feel at least 10 movements. 5. You may stop counting after you have felt 10 movements, or if you have been counting for 2 hours. Write down the stop time. 6. If you do not feel 10 movements in 2 hours, contact your health care  provider for further instructions. Your health care provider may want to do additional tests to assess your baby's well-being. Contact a health care provider if:  You feel fewer than 10 movements in 2 hours.  Your baby is not moving like he or she usually does. Date: ____________ Start time: ____________ Stop time: ____________ Movements: ____________ Date: ____________ Start time: ____________ Stop time: ____________ Movements: ____________ Date: ____________ Start time: ____________ Stop time: ____________ Movements: ____________ Date: ____________ Start time: ____________ Stop time: ____________ Movements: ____________ Date: ____________ Start time: ____________ Stop time: ____________ Movements: ____________ Date: ____________ Start time: ____________ Stop time: ____________ Movements: ____________ Date: ____________ Start time: ____________ Stop time: ____________ Movements: ____________ Date: ____________ Start time: ____________ Stop time: ____________ Movements: ____________ Date: ____________ Start time: ____________ Stop time: ____________ Movements: ____________ This information is not intended to replace advice given to you by your health care provider. Make sure you discuss any questions you have with your health care provider. Document Revised: 12/10/2018 Document Reviewed: 12/10/2018 Elsevier Patient Education  2021 [employer]   Preeclampsia and Eclampsia Preeclampsia is a serious condition that may develop during pregnancy. This condition involves high blood pressure during pregnancy and causes symptoms such as headaches, vision changes, and increased swelling in the legs, hands, and face. Preeclampsia occurs after 20 weeks of pregnancy. Eclampsia is a seizure that happens from worsening preeclampsia. Diagnosing and managing preeclampsia early is important. If not treated early, it can cause serious problems for mother and baby. There is no cure for this condition. However,  during pregnancy, delivering the baby may be the best treatment for preeclampsia or eclampsia. For most women, symptoms of preeclampsia and eclampsia go away after giving birth. In rare cases, a woman may develop preeclampsia or eclampsia after giving birth. This usually occurs within 48 hours after childbirth but may occur up to 6 weeks after giving birth. What are the causes? The cause of this condition is not known. What increases the risk? The following factors make you more likely to develop preeclampsia:  Being pregnant for the first time or being pregnant with multiples.  Having had preeclampsia or a condition called hemolysis, elevated liver enzymes, and low platelet count (HELLP)syndrome during a past pregnancy.  Having a family history of preeclampsia.  Being older than age 24.  Being obese.  Becoming pregnant through fertility treatments. Conditions that reduce blood flow or oxygen to your placenta and baby may also increase your risk. These include:  High blood pressure before, during, or immediately following pregnancy.  Kidney disease.  Diabetes.  Blood clotting disorders.  Autoimmune diseases, such as lupus.  Sleep apnea. What are the signs  or symptoms? Common symptoms of this condition include:  A severe, throbbing headache that does not go away.  Vision problems, such as blurred or double vision and light sensitivity.  Pain in the stomach, especially the right upper region.  Pain in the shoulder. Other symptoms that may develop as the condition gets worse include:  Sudden weight gain because of fluid buildup in the body. This causes swelling of the face, hands, legs, and feet.  Severe nausea and vomiting.  Urinating less than usual.  Shortness of breath.  Seizures. How is this diagnosed? Your health care provider will ask you about symptoms and check for signs of preeclampsia during your prenatal visits. You will also have routine tests,  including:  Checking your blood pressure.  Urine tests to check for protein.  Blood tests to assess your organ function.  Monitoring your baby's heart rate.  Ultrasounds to check fetal growth.   How is this treated? You and your health care provider will determine the treatment that is best for you. Treatment may include:  Frequent prenatal visits to check for preeclampsia.  Medicine to lower your blood pressure.  Medicine to prevent seizures.  Low-dose aspirin during your pregnancy.  Staying in the hospital, in severe cases. You will be given medicines to control your blood pressure and the amount of fluids in your body.  Delivering your baby. Work with your health care provider to manage any chronic health conditions, such as diabetes or kidney problems. Also, work with your health care provider to manage weight gain during pregnancy. Follow these instructions at home: Eating and drinking  Drink enough fluid to keep your urine pale yellow.  Avoid caffeine. Caffeine may increase blood pressure and heart rate and lead to dehydration.  Reduce the amount of salt that you eat. Lifestyle  Do not use any products that contain nicotine or tobacco. These products include cigarettes, chewing tobacco, and vaping devices, such as e-cigarettes. If you need help quitting, ask your health care provider.  Do not use alcohol or drugs.  Avoid stress as much as possible.  Rest and get plenty of sleep. General instructions  Take over-the-counter and prescription medicines only as told by your health care provider.  When lying down, lie on your left side. This keeps pressure off your major blood vessels.  When sitting or lying down, raise (elevate) your feet. Try putting pillows underneath your lower legs.  Exercise regularly. Ask your health care provider what kinds of exercise are best for you.  Check your blood pressure as often as recommended by your health care  provider.  Keep all prenatal and follow-up visits. This is important.   Contact a health care provider if:  You have symptoms that may need treatment or closer monitoring. These include: ? Headaches. ? Stomach pain or nausea and vomiting. ? Shoulder pain. ? Vision problems, such as spots in front of your eyes or blurry vision. ? Sudden weight gain or increased swelling in your face, hands, legs, and feet. ? Increased anxiety or feeling of impending doom. ? Signs or symptoms of labor. Get help right away if:  You have any of the following symptoms: ? A seizure. ? Shortness of breath or trouble breathing. ? Trouble speaking or slurred speech. ? Fainting. ? Chest pain. These symptoms may represent a serious problem that is an emergency. Do not wait to see if the symptoms will go away. Get medical help right away. Call your local emergency services (911 in the [place]  Do not drive yourself to the hospital. Summary  Preeclampsia is a serious condition that may develop during pregnancy.  Diagnosing and treating preeclampsia early is very important.  Keep all prenatal and follow-up visits. This is important.  Get help right away if you have a seizure, shortness of breath or trouble breathing, trouble speaking or slurred speech, chest pain, or fainting. This information is not intended to replace advice given to you by your health care provider. Make sure you discuss any questions you have with your health care provider. Document Revised: 01/13/2020 Document Reviewed: 01/13/2020 Elsevier Patient Education  2021 [employer].

## 2020-09-30 NOTE — Discharge Summary (Signed)
Antenatal Discharge Summary  Patient ID: Athanasia Stanwood MRN: [id number] DOB/AGE: Nov 05, 1995 25 y.o.  Admit date: 09/29/2020   Discharge date: 09/30/2020  Admission Diagnoses: Elevated blood pressure in pregnancy without the diagnosis of hypertension  Discharge Diagnoses: Same  Prenatal Procedures: NST, medications, and labs  Significant Diagnostic Studies:   Results for orders placed or performed during the hospital encounter of 09/29/20 (from the past 168 hour(s))  Comprehensive metabolic panel   Collection Time: 09/29/20  5:19 PM  Result Value Ref Range   Sodium 135 135 - 145 mmol/L   Potassium 3.2 (L) 3.5 - 5.1 mmol/L   Chloride 106 98 - 111 mmol/L   CO2 21 (L) 22 - 32 mmol/L   Glucose, Bld 79 70 - 99 mg/dL   BUN 8 6 - 20 mg/dL   Creatinine, Ser 0.81 0.44 - 1.00 mg/dL   Calcium 8.5 (L) 8.9 - 10.3 mg/dL   Total Protein 6.3 (L) 6.5 - 8.1 g/dL   Albumin 2.9 (L) 3.5 - 5.0 g/dL   AST 16 15 - 41 U/L   ALT 14 0 - 44 U/L   Alkaline Phosphatase 83 38 - 126 U/L   Total Bilirubin 0.4 0.3 - 1.2 mg/dL   GFR, Estimated >60 >60 mL/min   Anion gap 8 5 - 15  CBC   Collection Time: 09/29/20  5:19 PM  Result Value Ref Range   WBC 11.9 (H) 4.0 - 10.5 K/uL   RBC 3.29 (L) 3.87 - 5.11 MIL/uL   Hemoglobin 9.9 (L) 12.0 - 15.0 g/dL   HCT 28.5 (L) 36.0 - 46.0 %   MCV 86.6 80.0 - 100.0 fL   MCH 30.1 26.0 - 34.0 pg   MCHC 34.7 30.0 - 36.0 g/dL   RDW 12.5 11.5 - 15.5 %   Platelets 283 150 - 400 K/uL   nRBC 0.0 0.0 - 0.2 %  Protein / creatinine ratio, urine   Collection Time: 09/29/20  5:25 PM  Result Value Ref Range   Creatinine, Urine 127 mg/dL   Total Protein, Urine 13 mg/dL   Protein Creatinine Ratio 0.10 0.00 - 0.15 mg/mg[Cre]  Resp Panel by RT-PCR (Flu A&B, Covid) Nasopharyngeal Swab   Collection Time: 09/29/20  5:32 PM   Specimen: Nasopharyngeal Swab; Nasopharyngeal(NP) swabs in vial transport medium  Result Value Ref Range   SARS Coronavirus 2 by RT PCR NEGATIVE NEGATIVE    Influenza A by PCR NEGATIVE NEGATIVE   Influenza B by PCR NEGATIVE NEGATIVE  CBC   Collection Time: 09/30/20  6:32 AM  Result Value Ref Range   WBC 9.1 4.0 - 10.5 K/uL   RBC 3.34 (L) 3.87 - 5.11 MIL/uL   Hemoglobin 10.0 (L) 12.0 - 15.0 g/dL   HCT 29.4 (L) 36.0 - 46.0 %   MCV 88.0 80.0 - 100.0 fL   MCH 29.9 26.0 - 34.0 pg   MCHC 34.0 30.0 - 36.0 g/dL   RDW 12.8 11.5 - 15.5 %   Platelets 236 150 - 400 K/uL   nRBC 0.0 0.0 - 0.2 %  Comprehensive metabolic panel   Collection Time: 09/30/20  6:32 AM  Result Value Ref Range   Sodium 137 135 - 145 mmol/L   Potassium 3.1 (L) 3.5 - 5.1 mmol/L   Chloride 107 98 - 111 mmol/L   CO2 22 22 - 32 mmol/L   Glucose, Bld 88 70 - 99 mg/dL   BUN 5 (L) 6 - 20 mg/dL   Creatinine, Ser 0.58 0.44 -  1.00 mg/dL   Calcium 8.1 (L) 8.9 - 10.3 mg/dL   Total Protein 5.7 (L) 6.5 - 8.1 g/dL   Albumin 2.6 (L) 3.5 - 5.0 g/dL   AST 16 15 - 41 U/L   ALT 13 0 - 44 U/L   Alkaline Phosphatase 72 38 - 126 U/L   Total Bilirubin 0.3 0.3 - 1.2 mg/dL   GFR, Estimated >60 >60 mL/min   Anion gap 8 5 - 15  Protein / creatinine ratio, urine   Collection Time: 09/30/20  7:37 AM  Result Value Ref Range   Creatinine, Urine 26 mg/dL   Total Protein, Urine <6 mg/dL   Protein Creatinine Ratio        0.00 - 0.15 mg/mg[Cre]  Results for orders placed or performed in visit on 09/27/20 (from the past 168 hour(s))  POC Urinalysis Dipstick OB   Collection Time: 09/27/20  4:09 PM  Result Value Ref Range   Color, UA     Clarity, UA     Glucose, UA Negative Negative   Bilirubin, UA Negative    Ketones, UA Negative    Spec Grav, UA 1.010 1.010 - 1.025   Blood, UA Negative    pH, UA 7.0 5.0 - 8.0   POC,PROTEIN,UA Trace Negative, Trace, Small (1+), Moderate (2+), Large (3+), 4+   Urobilinogen, UA 0.2 0.2 or 1.0 E.U./dL   Nitrite, UA Negative    Leukocytes, UA Negative Negative   Appearance     Odor    CBC with Differential/Platelet   Collection Time: 09/27/20  4:12 PM   Result Value Ref Range   WBC 10.8 3.4 - 10.8 x10E3/uL   RBC 3.62 (L) 3.77 - 5.28 x10E6/uL   Hemoglobin 10.8 (L) 11.1 - 15.9 g/dL   Hematocrit 31.4 (L) 34.0 - 46.6 %   MCV 87 79 - 97 fL   MCH 29.8 26.6 - 33.0 pg   MCHC 34.4 31.5 - 35.7 g/dL   RDW 11.9 11.7 - 15.4 %   Platelets 305 150 - 450 x10E3/uL   Neutrophils 74 Not Estab. %   Lymphs 17 Not Estab. %   Monocytes 7 Not Estab. %   Eos 1 Not Estab. %   Basos 0 Not Estab. %   Neutrophils Absolute 8.1 (H) 1.4 - 7.0 x10E3/uL   Lymphocytes Absolute 1.8 0.7 - 3.1 x10E3/uL   Monocytes Absolute 0.8 0.1 - 0.9 x10E3/uL   EOS (ABSOLUTE) 0.1 0.0 - 0.4 x10E3/uL   Basophils Absolute 0.0 0.0 - 0.2 x10E3/uL   Immature Granulocytes 1 Not Estab. %   Immature Grans (Abs) 0.1 0.0 - 0.1 x10E3/uL  Comprehensive metabolic panel   Collection Time: 09/27/20  4:12 PM  Result Value Ref Range   Glucose 85 65 - 99 mg/dL   BUN 10 6 - 20 mg/dL   Creatinine, Ser 0.80 0.57 - 1.00 mg/dL   eGFR 105 >59 mL/min/1.73   BUN/Creatinine Ratio 13 9 - 23   Sodium 138 134 - 144 mmol/L   Potassium 4.4 3.5 - 5.2 mmol/L   Chloride 102 96 - 106 mmol/L   CO2 23 20 - 29 mmol/L   Calcium 9.3 8.7 - 10.2 mg/dL   Total Protein 6.0 6.0 - 8.5 g/dL   Albumin 3.8 (L) 3.9 - 5.0 g/dL   Globulin, Total 2.2 1.5 - 4.5 g/dL   Albumin/Globulin Ratio 1.7 1.2 - 2.2   Bilirubin Total <0.2 0.0 - 1.2 mg/dL   Alkaline Phosphatase 101 44 - 121 IU/L  AST 15 0 - 40 IU/L   ALT 10 0 - 32 IU/L  Strep Gp B NAA   Collection Time: 09/27/20  4:34 PM   Specimen: Vaginal/Rectal; GYN   VR  Result Value Ref Range   Strep Gp B NAA Negative Negative  Protein / creatinine ratio, urine   Collection Time: 09/27/20  4:36 PM  Result Value Ref Range   Creatinine, Urine 110.4 Not Estab. mg/dL   Protein, Ur 14.9 Not Estab. mg/dL   Protein/Creat Ratio 135 0 - 200 mg/g creat  GC/Chlamydia Probe Amp   Collection Time: 09/27/20  4:41 PM   Specimen: Vaginal; GYN   [facility]  Result Value Ref Range   Chlamydia  trachomatis, NAA Negative Negative   Neisseria Gonorrhoeae by PCR Negative Negative    Treatments: IV hydration  Hospital Course:   This is a 25 y.o. G2P0101 with IUP at 57w0dplaced in observation due to elevated blood pressure with the third trimester without diagnosis of hypertension.  She was admitted headache, blurred vision, nausea and vomiting.  No leaking of fluid and no bleeding.    She was started on IV fluids and antiemetics.  She was observed, fetal heart rate and blood pressure monitoring remained reassuring, and she had no signs/symptoms  labor or other maternal-fetal concerns.    Her cervical exam was unchanged from admission.    She was deemed stable for discharge to home with outpatient follow up.  Discharge Exam:  Temp:  [98.1 F (36.7 C)-98.5 F (36.9 C)] 98.5 F (36.9 C) (05/28 0734) Pulse Rate:  [77-109] 88 (05/28 0809) Resp:  [16-17] 16 (05/28 0734) BP: (105-136)/(59-92) 126/84 (05/28 0809)  General appearance: alert and cooperative Head: Normocephalic, without obvious abnormality, atraumatic Resp: clear to auscultation bilaterally Cardio: normal apical impulse Neurologic: Alert and oriented X 3, normal strength and tone. Normal symmetric reflexes. Normal coordination and gait  Discharge Condition: Stable  Disposition: Discharge disposition: 01-Home or Self Care       Discharge Instructions    Discharge activity:  No Restrictions   Complete by: As directed    Discharge diet:  No restrictions   Complete by: As directed    Discharge instructions   Complete by: As directed    See AVS   Fetal Kick Count:  Lie on our left side for one hour after a meal, and count the number of times your baby kicks.  If it is less than 5 times, get up, move around and drink some juice.  Repeat the test 30 minutes later.  If it is still less than 5 kicks in an hour, notify your doctor.   Complete by: As directed    LABOR:  When conractions begin, you should start  to time them from the beginning of one contraction to the beginning  of the next.  When contractions are 5 - 10 minutes apart or less and have been regular for at least an hour, you should call your health care provider.   Complete by: As directed    No sexual activity restrictions   Complete by: As directed    Notify physician for bleeding from the vagina   Complete by: As directed    Notify physician for blurring of vision or spots before the eyes   Complete by: As directed    Notify physician for chills or fever   Complete by: As directed    Notify physician for fainting spells, "black outs" or loss of consciousness   Complete  by: As directed    Notify physician for increase in vaginal discharge   Complete by: As directed    Notify physician for leaking of fluid   Complete by: As directed    Notify physician for pain or burning when urinating   Complete by: As directed    Notify physician for pelvic pressure (sudden increase)   Complete by: As directed    Notify physician for severe or continued nausea or vomiting   Complete by: As directed    Notify physician for sudden gushing of fluid from the vagina (with or without continued leaking)   Complete by: As directed    Notify physician for sudden, constant, or occasional abdominal pain   Complete by: As directed    Notify physician if baby moving less than usual   Complete by: As directed      Allergies as of 09/30/2020      Reactions   Bee Venom Hives, Rash   Sulfa Antibiotics Hives, Rash      Medication List    STOP taking these medications   busPIRone 7.5 MG tablet Commonly known as: BUSPAR     TAKE these medications   albuterol 108 (90 Base) MCG/ACT inhaler Commonly known as: VENTOLIN HFA Inhale into the lungs.   aspirin EC 81 MG tablet Take 1 tablet (81 mg total) by mouth daily. Swallow whole. Start at 14 weeks   butalbital-acetaminophen-caffeine 50-325-40 MG tablet Commonly known as: FIORICET Take 1 tablet  by mouth every 6 (six) hours as needed for headache or migraine.   calcium carbonate 500 MG chewable tablet Commonly known as: TUMS - dosed in mg elemental calcium Chew 1 tablet (200 mg of elemental calcium total) by mouth in the morning, at noon, in the evening, and at bedtime.   Magnesium Oxide 420 (252 Mg) MG Tabs Take 1 tablet by mouth in the morning and at bedtime.   multivitamin-prenatal 27-0.8 MG Tabs tablet Take 1 tablet by mouth daily at 12 noon.   ondansetron 4 MG disintegrating tablet Commonly known as: Zofran ODT Take 1 tablet (4 mg total) by mouth every 6 (six) hours as needed for nausea.   promethazine 12.5 MG tablet Commonly known as: PHENERGAN Take 1 tablet (12.5 mg total) by mouth every 6 (six) hours as needed for nausea or vomiting.       Dani Gobble, CNM [facility], [facility] 09/30/20 8:43 AM

## 2020-10-05 ENCOUNTER — Other Ambulatory Visit: Payer: Self-pay

## 2020-10-05 ENCOUNTER — Ambulatory Visit (INDEPENDENT_AMBULATORY_CARE_PROVIDER_SITE_OTHER): Payer: Managed Care, Other (non HMO) | Admitting: Certified Nurse Midwife

## 2020-10-05 ENCOUNTER — Encounter: Payer: Self-pay | Admitting: Certified Nurse Midwife

## 2020-10-05 VITALS — BP 128/90 | HR 121 | Wt 212.2 lb

## 2020-10-05 DIAGNOSIS — Z0289 Encounter for other administrative examinations: Secondary | ICD-10-CM

## 2020-10-05 DIAGNOSIS — O212 Late vomiting of pregnancy: Secondary | ICD-10-CM

## 2020-10-05 DIAGNOSIS — Z3A37 37 weeks gestation of pregnancy: Secondary | ICD-10-CM

## 2020-10-05 DIAGNOSIS — O09299 Supervision of pregnancy with other poor reproductive or obstetric history, unspecified trimester: Secondary | ICD-10-CM

## 2020-10-05 DIAGNOSIS — O0993 Supervision of high risk pregnancy, unspecified, third trimester: Secondary | ICD-10-CM

## 2020-10-05 DIAGNOSIS — Z3403 Encounter for supervision of normal first pregnancy, third trimester: Secondary | ICD-10-CM

## 2020-10-05 LAB — POCT URINALYSIS DIPSTICK OB
Bilirubin, UA: NEGATIVE
Blood, UA: NEGATIVE
Glucose, UA: NEGATIVE
Ketones, UA: NEGATIVE
Nitrite, UA: NEGATIVE
POC,PROTEIN,UA: NEGATIVE
Spec Grav, UA: 1.01 (ref 1.010–1.025)
Urobilinogen, UA: 0.2 E.U./dL
pH, UA: 7.5 (ref 5.0–8.0)

## 2020-10-05 MED ORDER — METOCLOPRAMIDE HCL 10 MG PO TABS: 10.0000 mg | ORAL_TABLET | Freq: Four times a day (QID) | ORAL | 2 refills | Status: DC | PRN

## 2020-10-05 NOTE — Progress Notes (Signed)
ROB-Reports return of nausea and vomiting, unable to take phenergan during the day due to fatigue. Short term disability paperwork submitted to front desk, work note given for patient to start leave now. Labs today, see orders. Requests SVE due to increased pelvic pressure. Anticipatory guidance regarding course of prenatal care. Reviewed red flag symptoms and when to call. RTC x 1 week for ROB with ANNIE or sooner if neeeded.   Telephone call from patient at 1655: Reporting spotting since SVE. Endorses fetal movement. Denies contractions or leakage for fluid. Advised to place pad and continue to monitor if bleeding increases or contractions start, then report to Labor and Delivery for evaluation.

## 2020-10-05 NOTE — Patient Instructions (Signed)
Vaginal Birth After Cesarean Delivery  Vaginal birth after cesarean delivery (VBAC) is giving birth vaginally after previously delivering a baby through a cesarean section (C-section). A VBAC may be a safe option for you, depending on your health and other factors. It is important to discuss VBAC with your health care provider early in your pregnancy so you can understand the risks, benefits, and options. Having these discussions early will give you time to make your birth plan. Who are the best candidates for VBAC? The best candidates for VBAC are women who:  Have had one or two prior cesarean deliveries, and the incision made during the delivery was horizontal (low transverse).  Do not have a vertical (classical) scar on their uterus.  Have not had a tear in the wall of their uterus (uterine rupture).  Plan to have more pregnancies. A VBAC is also more likely to be successful:  In women who have previously given birth vaginally.  When labor starts by itself (spontaneously) before the due date. What are the benefits of VBAC? The benefits of delivering your baby vaginally instead of by a cesarean delivery include:  A shorter hospital stay.  A faster recovery time.  Less pain.  Avoiding risks associated with major surgery, such as infection and blood clots.  Less blood loss and less need for donated blood (transfusions). What are the risks of VBAC? The main risk of attempting a VBAC is that it may fail, forcing your health care provider to deliver your baby by a C-section. Other risks are rare and include:  Tearing (rupture) of the scar from a past cesarean delivery.  Other risks associated with vaginal deliveries. If a repeat cesarean delivery is needed, the risks include:  Blood loss.  Infection.  Blood clot.  Damage to surrounding organs.  Removal of the uterus (hysterectomy), if it is damaged.  Placenta problems in future pregnancies. What else should I know  about my options? Delivering a baby through a VBAC is similar to having a normal spontaneous vaginal delivery. Therefore, it is safe:  To try with twins.  For your health care provider to try to turn the baby from a breech position (external cephalic version) during labor.  With epidural analgesia for pain relief. Consider where you would like to deliver your baby. VBAC should be attempted in facilities where an emergency cesarean delivery can be performed. VBAC is not recommended for home births. Any changes in your health or your baby's health during your pregnancy may make it necessary to change your initial decision about VBAC. Your health care provider may recommend that you do not attempt a VBAC if:  Your baby's suspected weight is 8.8 lb (4 kg) or more.  You have preeclampsia. This is a condition that causes high blood pressure along with other symptoms, such as swelling and headaches.  You will have VBAC less than 19 months after your cesarean delivery.  You are past your due date.  You need to have labor started (induced) because your cervix is not ready for labor (unfavorable). Where to find more information  American Pregnancy Association: americanpregnancy.org  American Congress of Obstetricians and Gynecologists: acog.org Summary  Vaginal birth after cesarean delivery (VBAC) is giving birth vaginally after previously delivering a baby through a cesarean section (C-section). A VBAC may be a safe option for you, depending on your health and other factors.  Discuss VBAC with your health care provider early in your pregnancy so you can understand the risks, benefits, options, and   have plenty of time to make your birth plan.  The main risk of attempting a VBAC is that it may fail, forcing your health care provider to deliver your baby by a C-section. Other risks are rare. This information is not intended to replace advice given to you by your health care provider. Make sure  you discuss any questions you have with your health care provider. Document Revised: 08/18/2018 Document Reviewed: 07/30/2016 Elsevier Patient Education  2021 Elsevier Inc.   Morning Sickness  Morning sickness is when you feel like you may vomit (feel nauseous) during pregnancy. Sometimes, you may vomit. Morning sickness most often happens in the morning, but it can also happen at any time of the day. Some women may have morning sickness that makes them vomit all the time. This is a more serious problem that needs treatment. What are the causes? The cause of this condition is not known. What increases the risk?  You had vomiting or a feeling like you may vomit before your pregnancy.  You had morning sickness in another pregnancy.  You are pregnant with more than one baby, such as twins. What are the signs or symptoms?  Feeling like you may vomit.  Vomiting. How is this treated? Treatment is usually not needed for this condition. You may only need to change what you eat. In some cases, your doctor may give you some things to take for your condition. These include:  Vitamin B6 supplements.  Medicines to treat the feeling that you may vomit.  Ginger. Follow these instructions at home: Medicines  Take over-the-counter and prescription medicines only as told by your doctor. Do not take any medicines until you talk with your doctor about them first.  Take multivitamins before you get pregnant. These can stop or lessen the symptoms of morning sickness. Eating and drinking  Eat dry toast or crackers before getting out of bed.  Eat 5 or 6 small meals a day.  Eat dry and bland foods like rice and baked potatoes.  Do not eat greasy, fatty, or spicy foods.  Have someone cook for you if the smell of food causes you to vomit or to feel like you may vomit.  If you feel like you may vomit after taking prenatal vitamins, take them at night or with a snack.  Eat protein foods when  you need a snack. Nuts, yogurt, and cheese are good choices.  Drink fluids throughout the day.  Try ginger ale made with real ginger, ginger tea made from fresh grated ginger, or ginger candies. General instructions  Do not smoke or use any products that contain nicotine or tobacco. If you need help quitting, ask your doctor.  Use an air purifier to keep the air in your house free of smells.  Get lots of fresh air.  Try to avoid smells that make you feel sick.  Try wearing an acupressure wristband. This is a wristband that is used to treat seasickness.  Try a treatment called acupuncture. In this treatment, a doctor puts needles into certain areas of your body to make you feel better. Contact a doctor if:  You need medicine to feel better.  You feel dizzy or light-headed.  You are losing weight. Get help right away if:  The feeling that you may vomit will not go away, or you cannot stop vomiting.  You faint.  You have very bad pain in your belly. Summary  Morning sickness is when you feel like you may vomit (feel  nauseous) during pregnancy.  You may feel sick in the morning, but you can feel this way at any time of the day.  Making some changes to what you eat may help your symptoms go away. This information is not intended to replace advice given to you by your health care provider. Make sure you discuss any questions you have with your health care provider. Document Revised: 12/06/2019 Document Reviewed: 11/15/2019 Elsevier Patient Education  2021 Elsevier Inc.    Fetal Movement Counts Patient Name: ________________________________________________ Patient Due Date: ____________________  What is a fetal movement count? A fetal movement count is the number of times that you feel your baby move during a certain amount of time. This may also be called a fetal kick count. A fetal movement count is recommended for every pregnant woman. You may be asked to start counting  fetal movements as early as week 28 of your pregnancy. Pay attention to when your baby is most active. You may notice your baby's sleep and wake cycles. You may also notice things that make your baby move more. You should do a fetal movement count:  When your baby is normally most active.  At the same time each day. A good time to count movements is while you are resting, after having something to eat and drink. How do I count fetal movements? 1. Find a quiet, comfortable area. Sit, or lie down on your side. 2. Write down the date, the start time and stop time, and the number of movements that you felt between those two times. Take this information with you to your health care visits. 3. Write down your start time when you feel the first movement. 4. Count kicks, flutters, swishes, rolls, and jabs. You should feel at least 10 movements. 5. You may stop counting after you have felt 10 movements, or if you have been counting for 2 hours. Write down the stop time. 6. If you do not feel 10 movements in 2 hours, contact your health care provider for further instructions. Your health care provider may want to do additional tests to assess your baby's well-being. Contact a health care provider if:  You feel fewer than 10 movements in 2 hours.  Your baby is not moving like he or she usually does. Date: ____________ Start time: ____________ Stop time: ____________ Movements: ____________ Date: ____________ Start time: ____________ Stop time: ____________ Movements: ____________ Date: ____________ Start time: ____________ Stop time: ____________ Movements: ____________ Date: ____________ Start time: ____________ Stop time: ____________ Movements: ____________ Date: ____________ Start time: ____________ Stop time: ____________ Movements: ____________ Date: ____________ Start time: ____________ Stop time: ____________ Movements: ____________ Date: ____________ Start time: ____________ Stop time:  ____________ Movements: ____________ Date: ____________ Start time: ____________ Stop time: ____________ Movements: ____________ Date: ____________ Start time: ____________ Stop time: ____________ Movements: ____________ This information is not intended to replace advice given to you by your health care provider. Make sure you discuss any questions you have with your health care provider. Document Revised: 12/10/2018 Document Reviewed: 12/10/2018 Elsevier Patient Education  2021 [employer].

## 2020-10-05 NOTE — Progress Notes (Signed)
ROB: she has been vomiting again during the day while she is at work. She can't take the Zofran while working, it makes her sleepy.

## 2020-10-06 LAB — COMPREHENSIVE METABOLIC PANEL
ALT: 11 IU/L (ref 0–32)
AST: 13 IU/L (ref 0–40)
Albumin/Globulin Ratio: 1.4 (ref 1.2–2.2)
Albumin: 3.5 g/dL — ABNORMAL LOW (ref 3.9–5.0)
Alkaline Phosphatase: 102 IU/L (ref 44–121)
BUN/Creatinine Ratio: 9 (ref 9–23)
BUN: 6 mg/dL (ref 6–20)
Bilirubin Total: <0.2 mg/dL (ref 0.0–1.2)
CO2: 21 mmol/L (ref 20–29)
Calcium: 9.2 mg/dL (ref 8.7–10.2)
Chloride: 99 mmol/L (ref 96–106)
Creatinine, Ser: 0.65 mg/dL (ref 0.57–1.00)
Globulin, Total: 2.5 g/dL (ref 1.5–4.5)
Glucose: 104 mg/dL — ABNORMAL HIGH (ref 65–99)
Potassium: 3.9 mmol/L (ref 3.5–5.2)
Sodium: 137 mmol/L (ref 134–144)
Total Protein: 6 g/dL (ref 6.0–8.5)
eGFR: 126 mL/min/{1.73_m2} (ref >59)

## 2020-10-06 LAB — CBC
Hematocrit: 31.2 % — ABNORMAL LOW (ref 34.0–46.6)
Hemoglobin: 10.8 g/dL — ABNORMAL LOW (ref 11.1–15.9)
MCH: 29.6 pg (ref 26.6–33.0)
MCHC: 34.6 g/dL (ref 31.5–35.7)
MCV: 86 fL (ref 79–97)
Platelets: 299 10*3/uL (ref 150–450)
RBC: 3.65 x10E6/uL — ABNORMAL LOW (ref 3.77–5.28)
RDW: 12.3 % (ref 11.7–15.4)
WBC: 11.6 10*3/uL — ABNORMAL HIGH (ref 3.4–10.8)

## 2020-10-06 LAB — PROTEIN / CREATININE RATIO, URINE
Creatinine, Urine: 40.4 mg/dL
Protein, Ur: 5.6 mg/dL
Protein/Creat Ratio: 139 mg/g creat (ref 0–200)

## 2020-10-09 ENCOUNTER — Ambulatory Visit (INDEPENDENT_AMBULATORY_CARE_PROVIDER_SITE_OTHER): Payer: Managed Care, Other (non HMO) | Admitting: Certified Nurse Midwife

## 2020-10-09 ENCOUNTER — Other Ambulatory Visit: Payer: Self-pay

## 2020-10-09 ENCOUNTER — Encounter: Payer: Self-pay | Admitting: Certified Nurse Midwife

## 2020-10-09 VITALS — BP 132/88 | HR 112 | Wt 211.4 lb

## 2020-10-09 DIAGNOSIS — Z3403 Encounter for supervision of normal first pregnancy, third trimester: Secondary | ICD-10-CM

## 2020-10-09 DIAGNOSIS — Z3A38 38 weeks gestation of pregnancy: Secondary | ICD-10-CM

## 2020-10-09 LAB — POCT URINALYSIS DIPSTICK OB
Bilirubin, UA: NEGATIVE
Blood, UA: NEGATIVE
Glucose, UA: NEGATIVE
Ketones, UA: NEGATIVE
Leukocytes, UA: NEGATIVE
Nitrite, UA: NEGATIVE
Spec Grav, UA: 1.01 (ref 1.010–1.025)
Urobilinogen, UA: 0.2 E.U./dL
pH, UA: 6.5 (ref 5.0–8.0)

## 2020-10-09 NOTE — Progress Notes (Signed)
ROB doing well. Feels good movement. Reviewed labor precautions. States she has had some at home Bps that have been elevated 139/106-143-90 . States it occurs after cleaning, doing laundry bending and pick up. BP today normal. No headache or changes in her vision, no epigastric pain, no swelling. Pre e labs completed. Will follow up with results. SVE per pt request 3/90/-2. Follow up 1 wk or sooner as needed.   Doreene Burke, CNM

## 2020-10-09 NOTE — Progress Notes (Signed)
ROB: She continues to have visual floaters. Her blood pressure has been up and down, especially when she is up moving around. 139/106-143/90. She reports that she does not feel good today.,

## 2020-10-09 NOTE — Patient Instructions (Signed)

## 2020-10-10 ENCOUNTER — Encounter: Payer: Managed Care, Other (non HMO) | Admitting: Certified Nurse Midwife

## 2020-10-10 LAB — CBC WITH DIFFERENTIAL/PLATELET
Basophils Absolute: 0 10*3/uL (ref 0.0–0.2)
Basos: 0 %
EOS (ABSOLUTE): 0.1 10*3/uL (ref 0.0–0.4)
Eos: 1 %
Hematocrit: 31.3 % — ABNORMAL LOW (ref 34.0–46.6)
Hemoglobin: 10.7 g/dL — ABNORMAL LOW (ref 11.1–15.9)
Immature Grans (Abs): 0 10*3/uL (ref 0.0–0.1)
Immature Granulocytes: 0 %
Lymphocytes Absolute: 1.7 10*3/uL (ref 0.7–3.1)
Lymphs: 15 %
MCH: 29.4 pg (ref 26.6–33.0)
MCHC: 34.2 g/dL (ref 31.5–35.7)
MCV: 86 fL (ref 79–97)
Monocytes Absolute: 0.8 10*3/uL (ref 0.1–0.9)
Monocytes: 7 %
Neutrophils Absolute: 8.7 10*3/uL — ABNORMAL HIGH (ref 1.4–7.0)
Neutrophils: 77 %
Platelets: 291 10*3/uL (ref 150–450)
RBC: 3.64 x10E6/uL — ABNORMAL LOW (ref 3.77–5.28)
RDW: 12.6 % (ref 11.7–15.4)
WBC: 11.3 10*3/uL — ABNORMAL HIGH (ref 3.4–10.8)

## 2020-10-10 LAB — PROTEIN / CREATININE RATIO, URINE
Creatinine, Urine: 136.3 mg/dL
Protein, Ur: 18.1 mg/dL
Protein/Creat Ratio: 133 mg/g creat (ref 0–200)

## 2020-10-10 LAB — COMPREHENSIVE METABOLIC PANEL
ALT: 12 IU/L (ref 0–32)
AST: 16 IU/L (ref 0–40)
Albumin/Globulin Ratio: 1.3 (ref 1.2–2.2)
Albumin: 3.5 g/dL — ABNORMAL LOW (ref 3.9–5.0)
Alkaline Phosphatase: 106 IU/L (ref 44–121)
BUN/Creatinine Ratio: 10 (ref 9–23)
BUN: 7 mg/dL (ref 6–20)
Bilirubin Total: <0.2 mg/dL (ref 0.0–1.2)
CO2: 19 mmol/L — ABNORMAL LOW (ref 20–29)
Calcium: 10 mg/dL (ref 8.7–10.2)
Chloride: 103 mmol/L (ref 96–106)
Creatinine, Ser: 0.68 mg/dL (ref 0.57–1.00)
Globulin, Total: 2.6 g/dL (ref 1.5–4.5)
Glucose: 98 mg/dL (ref 65–99)
Potassium: 3.6 mmol/L (ref 3.5–5.2)
Sodium: 136 mmol/L (ref 134–144)
Total Protein: 6.1 g/dL (ref 6.0–8.5)
eGFR: 125 mL/min/{1.73_m2} (ref >59)

## 2020-10-16 ENCOUNTER — Encounter: Payer: Self-pay | Admitting: Certified Nurse Midwife

## 2020-10-16 ENCOUNTER — Other Ambulatory Visit: Payer: Self-pay

## 2020-10-16 ENCOUNTER — Ambulatory Visit (INDEPENDENT_AMBULATORY_CARE_PROVIDER_SITE_OTHER): Payer: Managed Care, Other (non HMO) | Admitting: Certified Nurse Midwife

## 2020-10-16 VITALS — BP 138/94 | HR 108 | Wt 213.2 lb

## 2020-10-16 DIAGNOSIS — O0993 Supervision of high risk pregnancy, unspecified, third trimester: Secondary | ICD-10-CM

## 2020-10-16 DIAGNOSIS — Z3A39 39 weeks gestation of pregnancy: Secondary | ICD-10-CM

## 2020-10-16 LAB — POCT URINALYSIS DIPSTICK OB
Bilirubin, UA: NEGATIVE
Blood, UA: NEGATIVE
Glucose, UA: NEGATIVE
Ketones, UA: NEGATIVE
Nitrite, UA: NEGATIVE
POC,PROTEIN,UA: NEGATIVE
Spec Grav, UA: 1.01 (ref 1.010–1.025)
Urobilinogen, UA: 0.2 E.U./dL
pH, UA: 7 (ref 5.0–8.0)

## 2020-10-16 NOTE — Progress Notes (Signed)
ROB-Doing well; elbowed by husband this morning while sleeping, endorses positive fetal movement. Membrane sweep completed during SVE today. Anticipatory guidance regarding postdates care. Reviewed red flag symptoms and when to call. RTC x 1 week for NST and ROB with ANNIE or sooner if needed.

## 2020-10-16 NOTE — Patient Instructions (Addendum)
Nonstress Test A nonstress test is a procedure that is done during pregnancy in order to check the baby's heartbeat. The procedure can help to show if the baby (fetus) is healthy. It is commonly done when: The baby is past his or her due date. The pregnancy is high risk. The baby is moving less than normal. The mother has lost a pregnancy in the past. The health care provider suspects a problem with the baby's growth. There is too much or too little amniotic fluid. The procedure is often done in the third trimester of pregnancy to find out ifan early delivery is needed and whether such a delivery is safe. During a nonstress test, the baby's heartbeat is monitored when the baby is resting and when the baby is moving. If the baby is healthy, the heart rate will increase when he or she moves or kicks and will return to normal when heor she rests. Tell a health care provider about: Any allergies you have. Any medical conditions you have. All medicines you are taking, including vitamins, herbs, eye drops, creams, and over-the-counter medicines. Any surgeries you have had. Any past pregnancies you have had. What are the risks? There are no risks to you or your baby from a nonstress test. This procedureshould not be painful or uncomfortable. What happens before the procedure? Eat a meal right before the test or as directed by your health care provider. Food may help encourage the baby to move. Use the restroom right before the test. What happens during the procedure?  Two monitors will be placed on your abdomen. One will record the baby's heart rate and the other will record the contractions of your uterus. You may be asked to lie down on your side or to sit upright. You may be given a button to press when you feel your baby move. Your health care provider will listen to your baby's heartbeat and record it. He or she may also watch your baby's heartbeat on a screen. If the baby seems to be  sleeping, you may be asked to drink some juice or soda, eat a snack, or change positions. The procedure may vary among health care providers and hospitals. What can I expect after procedure? Your health care provider will discuss the test results with you and make recommendations for the future. Depending on the results, your health care provider may order additional tests or another course of action. If your health care provider gave you any diet or activity instructions, make sure to follow them. Keep all follow-up visits. This is important. Summary A nonstress test is a procedure that is done during pregnancy in order to check the baby's heartbeat. The procedure can help show if the baby is healthy. The procedure is often done in the third trimester of pregnancy to find out if an early delivery is needed and whether such a delivery is safe. During a nonstress test, the baby's heartbeat is monitored when the baby is resting and when the baby is moving. If the baby is healthy, the heart rate will increase when he or she moves or kicks and will return to normal when he or she rests. Your health care provider will discuss the test results with you and make recommendations for the future. This information is not intended to replace advice given to you by your health care provider. Make sure you discuss any questions you have with your healthcare provider. Document Revised: 01/31/2020 Document Reviewed: 01/31/2020 Elsevier Patient Education  2022 Elsevier   Inc.    Fetal Movement Counts Patient Name: ________________________________________________ Patient DueDate: ____________________ What is a fetal movement count?  A fetal movement count is the number of times that you feel your baby move during a certain amount of time. This may also be called a fetal kick count. A fetal movement count is recommended for every pregnant woman. You may be askedto start counting fetal movements as early as week 28  of your pregnancy. Pay attention to when your baby is most active. You may notice your baby's sleep and wake cycles. You may also notice things that make your baby move more. You should do a fetal movement count: When your baby is normally most active. At the same time each day. A good time to count movements is while you are resting, after having somethingto eat and drink. How do I count fetal movements? Find a quiet, comfortable area. Sit, or lie down on your side. Write down the date, the start time and stop time, and the number of movements that you felt between those two times. Take this information with you to your health care visits. Write down your start time when you feel the first movement. Count kicks, flutters, swishes, rolls, and jabs. You should feel at least 10 movements. You may stop counting after you have felt 10 movements, or if you have been counting for 2 hours. Write down the stop time. If you do not feel 10 movements in 2 hours, contact your health care provider for further instructions. Your health care provider may want to do additional tests to assess your baby's well-being. Contact a health care provider if: You feel fewer than 10 movements in 2 hours. Your baby is not moving like he or she usually does. Date: ____________ Start time: ____________ Stop time: ____________ Movements:____________ Date: ____________ Start time: ____________ Stop time: ____________ Movements:____________ Date: ____________ Start time: ____________ Stop time: ____________ Movements:____________ Date: ____________ Start time: ____________ Stop time: ____________ Movements:____________ Date: ____________ Start time: ____________ Stop time: ____________ Movements:____________ Date: ____________ Start time: ____________ Stop time: ____________ Movements:____________ Date: ____________ Start time: ____________ Stop time: ____________ Movements:____________ Date: ____________ Start time:  ____________ Stop time: ____________ Movements:____________ Date: ____________ Start time: ____________ Stop time: ____________ Movements:____________ This information is not intended to replace advice given to you by your health care provider. Make sure you discuss any questions you have with your healthcare provider. Document Revised: 12/10/2018 Document Reviewed: 12/10/2018 Elsevier Patient Education  2022 [employer].
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
## 2020-10-18 ENCOUNTER — Inpatient Hospital Stay
Admission: EM | Admit: 2020-10-18 | Discharge: 2020-10-20 | DRG: 807 | Disposition: A | Discharge status: Home / Self Care | Payer: Managed Care, Other (non HMO) | Attending: Certified Nurse Midwife | Admitting: Certified Nurse Midwife

## 2020-10-18 ENCOUNTER — Inpatient Hospital Stay: Payer: Managed Care, Other (non HMO) | Admitting: Anesthesiology

## 2020-10-18 ENCOUNTER — Other Ambulatory Visit: Payer: Self-pay

## 2020-10-18 ENCOUNTER — Encounter: Payer: Self-pay | Admitting: Obstetrics and Gynecology

## 2020-10-18 DIAGNOSIS — O09299 Supervision of pregnancy with other poor reproductive or obstetric history, unspecified trimester: Secondary | ICD-10-CM

## 2020-10-18 DIAGNOSIS — Z98891 History of uterine scar from previous surgery: Secondary | ICD-10-CM

## 2020-10-18 DIAGNOSIS — O34219 Maternal care for unspecified type scar from previous cesarean delivery: Secondary | ICD-10-CM | POA: Diagnosis present

## 2020-10-18 DIAGNOSIS — O134 Gestational [pregnancy-induced] hypertension without significant proteinuria, complicating childbirth: Principal | ICD-10-CM | POA: Diagnosis present

## 2020-10-18 DIAGNOSIS — Z20822 Contact with and (suspected) exposure to covid-19: Secondary | ICD-10-CM | POA: Diagnosis present

## 2020-10-18 DIAGNOSIS — O9902 Anemia complicating childbirth: Secondary | ICD-10-CM | POA: Diagnosis present

## 2020-10-18 DIAGNOSIS — O163 Unspecified maternal hypertension, third trimester: Secondary | ICD-10-CM | POA: Diagnosis present

## 2020-10-18 DIAGNOSIS — Z8759 Personal history of other complications of pregnancy, childbirth and the puerperium: Secondary | ICD-10-CM

## 2020-10-18 DIAGNOSIS — Z3A39 39 weeks gestation of pregnancy: Secondary | ICD-10-CM

## 2020-10-18 DIAGNOSIS — Z8751 Personal history of pre-term labor: Secondary | ICD-10-CM

## 2020-10-18 DIAGNOSIS — O26893 Other specified pregnancy related conditions, third trimester: Secondary | ICD-10-CM | POA: Diagnosis present

## 2020-10-18 LAB — COMPREHENSIVE METABOLIC PANEL
ALT: 13 U/L (ref 0–44)
AST: 18 U/L (ref 15–41)
Albumin: 2.9 g/dL — ABNORMAL LOW (ref 3.5–5.0)
Alkaline Phosphatase: 92 U/L (ref 38–126)
Anion gap: 5 (ref 5–15)
BUN: 8 mg/dL (ref 6–20)
CO2: 25 mmol/L (ref 22–32)
Calcium: 9.2 mg/dL (ref 8.9–10.3)
Chloride: 104 mmol/L (ref 98–111)
Creatinine, Ser: 0.71 mg/dL (ref 0.44–1.00)
GFR, Estimated: >60 mL/min (ref >60)
Glucose, Bld: 113 mg/dL — ABNORMAL HIGH (ref 70–99)
Potassium: 3.1 mmol/L — ABNORMAL LOW (ref 3.5–5.1)
Sodium: 134 mmol/L — ABNORMAL LOW (ref 135–145)
Total Bilirubin: 0.5 mg/dL (ref 0.3–1.2)
Total Protein: 6.5 g/dL (ref 6.5–8.1)

## 2020-10-18 LAB — RESP PANEL BY RT-PCR (FLU A&B, COVID) ARPGX2
Influenza A by PCR: NEGATIVE
Influenza B by PCR: NEGATIVE
SARS Coronavirus 2 by RT PCR: NEGATIVE

## 2020-10-18 LAB — CBC WITH DIFFERENTIAL/PLATELET
Abs Immature Granulocytes: 0.07 10*3/uL (ref 0.00–0.07)
Basophils Absolute: 0 10*3/uL (ref 0.0–0.1)
Basophils Relative: 0 %
Eosinophils Absolute: 0.1 10*3/uL (ref 0.0–0.5)
Eosinophils Relative: 1 %
HCT: 32 % — ABNORMAL LOW (ref 36.0–46.0)
Hemoglobin: 10.8 g/dL — ABNORMAL LOW (ref 12.0–15.0)
Immature Granulocytes: 1 %
Lymphocytes Relative: 14 %
Lymphs Abs: 1.8 10*3/uL (ref 0.7–4.0)
MCH: 29.6 pg (ref 26.0–34.0)
MCHC: 33.8 g/dL (ref 30.0–36.0)
MCV: 87.7 fL (ref 80.0–100.0)
Monocytes Absolute: 0.7 10*3/uL (ref 0.1–1.0)
Monocytes Relative: 6 %
Neutro Abs: 10.4 10*3/uL — ABNORMAL HIGH (ref 1.7–7.7)
Neutrophils Relative %: 78 %
Platelets: 253 10*3/uL (ref 150–400)
RBC: 3.65 MIL/uL — ABNORMAL LOW (ref 3.87–5.11)
RDW: 13.1 % (ref 11.5–15.5)
WBC: 13.2 10*3/uL — ABNORMAL HIGH (ref 4.0–10.5)
nRBC: 0 % (ref 0.0–0.2)

## 2020-10-18 LAB — TYPE AND SCREEN
ABO/RH(D): A POS
Antibody Screen: NEGATIVE

## 2020-10-18 LAB — PROTEIN / CREATININE RATIO, URINE
Creatinine, Urine: 102 mg/dL
Protein Creatinine Ratio: 0.17 mg/mg{Cre} — ABNORMAL HIGH (ref 0.00–0.15)
Total Protein, Urine: 17 mg/dL

## 2020-10-18 LAB — RPR: RPR Ser Ql: NONREACTIVE

## 2020-10-18 MED ORDER — SOD CITRATE-CITRIC ACID 500-334 MG/5ML PO SOLN: 30.0000 mL | Freq: Once | ORAL | Status: AC

## 2020-10-18 MED ORDER — FERROUS SULFATE 325 (65 FE) MG PO TABS
325.0000 mg | ORAL_TABLET | Freq: Every day | ORAL | Status: DC
  Administered 2020-10-19 – 2020-10-20 (×2): 325 mg via ORAL
  Filled 2020-10-18 (×2): qty 1

## 2020-10-18 MED ORDER — SOD CITRATE-CITRIC ACID 500-334 MG/5ML PO SOLN
ORAL | Status: AC
  Administered 2020-10-18: 30 mL
  Filled 2020-10-18: qty 15

## 2020-10-18 MED ORDER — OXYCODONE-ACETAMINOPHEN 5-325 MG PO TABS: 2.0000 | ORAL_TABLET | ORAL | Status: DC | PRN

## 2020-10-18 MED ORDER — DIBUCAINE (PERIANAL) 1 % EX OINT
1.0000 "application " | TOPICAL_OINTMENT | CUTANEOUS | Status: DC | PRN
  Filled 2020-10-18 (×3): qty 28

## 2020-10-18 MED ORDER — LIDOCAINE HCL (PF) 1 % IJ SOLN
30.0000 mL | INTRAMUSCULAR | Status: DC | PRN
  Filled 2020-10-18: qty 30

## 2020-10-18 MED ORDER — FENTANYL 2.5 MCG/ML W/ROPIVACAINE 0.15% IN NS 100 ML EPIDURAL (ARMC)
EPIDURAL | Status: AC
  Filled 2020-10-18: qty 100

## 2020-10-18 MED ORDER — OXYTOCIN-SODIUM CHLORIDE 30-0.9 UT/500ML-% IV SOLN
1.0000 m[IU]/min | INTRAVENOUS | Status: DC
Start: 2020-10-18 — End: 2020-10-18
  Administered 2020-10-18: 2 m[IU]/min via INTRAVENOUS

## 2020-10-18 MED ORDER — MISOPROSTOL 200 MCG PO TABS
ORAL_TABLET | ORAL | Status: AC
  Filled 2020-10-18: qty 4

## 2020-10-18 MED ORDER — AMMONIA AROMATIC IN INHA
RESPIRATORY_TRACT | Status: AC
  Filled 2020-10-18: qty 10

## 2020-10-18 MED ORDER — SIMETHICONE 80 MG PO CHEW: 80.0000 mg | CHEWABLE_TABLET | ORAL | Status: DC | PRN

## 2020-10-18 MED ORDER — LIDOCAINE-EPINEPHRINE (PF) 1.5 %-1:200000 IJ SOLN
INTRAMUSCULAR | Status: DC | PRN
  Administered 2020-10-18: 5 mL via PERINEURAL

## 2020-10-18 MED ORDER — OXYTOCIN 10 UNIT/ML IJ SOLN
INTRAMUSCULAR | Status: AC
  Filled 2020-10-18: qty 2

## 2020-10-18 MED ORDER — BENZOCAINE-MENTHOL 20-0.5 % EX AERO
1.0000 "application " | INHALATION_SPRAY | CUTANEOUS | Status: DC | PRN
  Filled 2020-10-18 (×3): qty 56

## 2020-10-18 MED ORDER — METHYLERGONOVINE MALEATE 0.2 MG PO TABS
0.2000 mg | ORAL_TABLET | ORAL | Status: DC | PRN
  Filled 2020-10-18: qty 1

## 2020-10-18 MED ORDER — OXYTOCIN BOLUS FROM INFUSION
333.0000 mL | Freq: Once | INTRAVENOUS | Status: AC
  Administered 2020-10-18: 333 mL via INTRAVENOUS

## 2020-10-18 MED ORDER — LACTATED RINGERS IV SOLN: INTRAVENOUS | Status: DC

## 2020-10-18 MED ORDER — SENNOSIDES-DOCUSATE SODIUM 8.6-50 MG PO TABS
2.0000 | ORAL_TABLET | ORAL | Status: DC
  Administered 2020-10-19 – 2020-10-20 (×2): 2 via ORAL
  Filled 2020-10-18 (×2): qty 2

## 2020-10-18 MED ORDER — BUPIVACAINE HCL (PF) 0.25 % IJ SOLN
INTRAMUSCULAR | Status: DC | PRN
  Administered 2020-10-18 (×2): 4 mL via EPIDURAL

## 2020-10-18 MED ORDER — COCONUT OIL OIL
1.0000 "application " | TOPICAL_OIL | Status: DC | PRN
  Administered 2020-10-19: 1 via TOPICAL
  Filled 2020-10-18 (×2): qty 120

## 2020-10-18 MED ORDER — CALCIUM CARBONATE ANTACID 500 MG PO CHEW
2.0000 | CHEWABLE_TABLET | Freq: Two times a day (BID) | ORAL | Status: DC | PRN
  Administered 2020-10-18: 400 mg via ORAL

## 2020-10-18 MED ORDER — LIDOCAINE HCL (PF) 1 % IJ SOLN
INTRAMUSCULAR | Status: DC | PRN
  Administered 2020-10-18 (×2): 3 mL via SUBCUTANEOUS

## 2020-10-18 MED ORDER — OXYCODONE-ACETAMINOPHEN 5-325 MG PO TABS
1.0000 | ORAL_TABLET | ORAL | Status: DC | PRN
  Administered 2020-10-18 – 2020-10-19 (×2): 1 via ORAL
  Filled 2020-10-18 (×2): qty 1

## 2020-10-18 MED ORDER — ACETAMINOPHEN 325 MG PO TABS: 650.0000 mg | ORAL_TABLET | ORAL | Status: DC | PRN

## 2020-10-18 MED ORDER — IBUPROFEN 600 MG PO TABS
600.0000 mg | ORAL_TABLET | Freq: Four times a day (QID) | ORAL | Status: DC
  Administered 2020-10-18 – 2020-10-20 (×6): 600 mg via ORAL
  Filled 2020-10-18 (×6): qty 1

## 2020-10-18 MED ORDER — ACETAMINOPHEN 325 MG PO TABS
650.0000 mg | ORAL_TABLET | ORAL | Status: DC | PRN
  Administered 2020-10-20: 650 mg via ORAL
  Filled 2020-10-18: qty 2

## 2020-10-18 MED ORDER — PRENATAL MULTIVITAMIN CH
1.0000 | ORAL_TABLET | Freq: Every day | ORAL | Status: DC
  Administered 2020-10-19 – 2020-10-20 (×2): 1 via ORAL
  Filled 2020-10-18 (×2): qty 1

## 2020-10-18 MED ORDER — ONDANSETRON HCL 4 MG PO TABS
4.0000 mg | ORAL_TABLET | ORAL | Status: DC | PRN
Start: 2020-10-18 — End: 2020-10-20
  Filled 2020-10-18: qty 1

## 2020-10-18 MED ORDER — TERBUTALINE SULFATE 1 MG/ML IJ SOLN: 0.2500 mg | Freq: Once | INTRAMUSCULAR | Status: DC | PRN

## 2020-10-18 MED ORDER — OXYTOCIN-SODIUM CHLORIDE 30-0.9 UT/500ML-% IV SOLN
2.5000 [IU]/h | INTRAVENOUS | Status: DC
  Filled 2020-10-18: qty 1000

## 2020-10-18 MED ORDER — CALCIUM CARBONATE ANTACID 500 MG PO CHEW
CHEWABLE_TABLET | ORAL | Status: AC
  Filled 2020-10-18: qty 2

## 2020-10-18 MED ORDER — WITCH HAZEL-GLYCERIN EX PADS
1.0000 "application " | MEDICATED_PAD | CUTANEOUS | Status: DC | PRN
  Filled 2020-10-18 (×3): qty 100

## 2020-10-18 MED ORDER — LACTATED RINGERS IV SOLN: 500.0000 mL | INTRAVENOUS | Status: DC | PRN

## 2020-10-18 MED ORDER — BUTORPHANOL TARTRATE 1 MG/ML IJ SOLN: 1.0000 mg | INTRAMUSCULAR | Status: DC | PRN

## 2020-10-18 MED ORDER — METHYLERGONOVINE MALEATE 0.2 MG/ML IJ SOLN: 0.2000 mg | INTRAMUSCULAR | Status: DC | PRN

## 2020-10-18 MED ORDER — ONDANSETRON HCL 4 MG/2ML IJ SOLN: 4.0000 mg | INTRAMUSCULAR | Status: DC | PRN

## 2020-10-18 MED ORDER — DOCUSATE SODIUM 100 MG PO CAPS
100.0000 mg | ORAL_CAPSULE | Freq: Two times a day (BID) | ORAL | Status: DC
  Administered 2020-10-19 – 2020-10-20 (×2): 100 mg via ORAL
  Filled 2020-10-18 (×2): qty 1

## 2020-10-18 NOTE — OB Triage Note (Signed)
Pt is a G2P0101 starting having contractions around 2:30a, 8 min apart. No bleeding, no LOF, some vomiting relieved with medication.

## 2020-10-18 NOTE — Anesthesia Procedure Notes (Addendum)
Epidural Patient location during procedure: OB Start time: 10/18/2020 9:29 AM  Staffing Anesthesiologist: Naomie Dean, MD Resident/CRNA: Elmarie Mainland, CRNA Other anesthesia staff: Irving Burton, CRNA Performed: anesthesiologist   Preanesthetic Checklist Completed: patient identified, IV checked, site marked, risks and benefits discussed, surgical consent, monitors and equipment checked, pre-op evaluation and timeout performed  Epidural Patient position: sitting Prep: ChloraPrep Patient monitoring: heart rate, continuous pulse ox and blood pressure Approach: midline Location: L3-L4 Injection technique: LOR saline  Needle:  Needle type: Tuohy  Needle gauge: 18 G Needle length: 9 cm and 9 Needle insertion depth: 8 cm Catheter type: closed end flexible Catheter size: 20 Guage Catheter at skin depth: 11 cm Test dose: negative and 1.5% lidocaine with Epi 1:200 K  Assessment Sensory level: T10 Events: blood not aspirated, injection not painful, no injection resistance, no paresthesia and negative IV test  Additional Notes 3 attempt Pt. Evaluated and documentation done after procedure finished. Patient identified. Risks/Benefits/Options discussed with patient including but not limited to bleeding, infection, nerve damage, paralysis, failed block, incomplete pain control, headache, blood pressure changes, nausea, vomiting, reactions to medication both or allergic, itching and postpartum back pain. Confirmed with bedside nurse the patient's most recent platelet count. Confirmed with patient that they are not currently taking any anticoagulation, have any bleeding history or any family history of bleeding disorders. Patient expressed understanding and wished to proceed. All questions were answered. Sterile technique was used throughout the entire procedure. Please see nursing notes for vital signs. Test dose was given through epidural catheter and negative prior to continuing  to dose epidural or start infusion. Warning signs of high block given to the patient including shortness of breath, tingling/numbness in hands, complete motor block, or any concerning symptoms with instructions to call for help. Patient was given instructions on fall risk and not to get out of bed. All questions and concerns addressed with instructions to call with any issues or inadequate analgesia.    Patient tolerated the insertion well without immediate complications.Reason for block:procedure for pain

## 2020-10-18 NOTE — H&P (Signed)
History and Physical   HPI  Anita Hurst is a 25 y.o. G2P0101 at [redacted]w[redacted]d Estimated Date of Delivery: 10/21/20 who is being admitted for labor management. She has history of cesarean delivery and desires a trial of labor after cesarean section.    OB History  OB History  Gravida Para Term Preterm AB Living  2 1 0 1 0 1  SAB IAB Ectopic Multiple Live Births  0 0 0 0 1    # Outcome Date GA Lbr Len/2nd Weight Sex Delivery Anes PTL Lv  2 Current           1 Preterm 11/05/15 [redacted]w[redacted]d   M CS-LTranv  Y LIV    PROBLEM LIST  Pregnancy complications or risks: Patient Active Problem List   Diagnosis Date Noted   Labor and delivery, indication for care 10/18/2020   Elevated blood pressure affecting pregnancy in third trimester, antepartum 09/29/2020   Anemia of pregnancy 08/11/2020   Vitreous floaters of both eyes 08/03/2020   Patient desires vaginal birth after cesarean section (VBAC) 05/25/2020   History of cesarean section 02/15/2020   History of preterm delivery 02/15/2020   History of HELLP syndrome, currently pregnant 02/15/2020   Family history of breast cancer in first degree relative 03/10/2019   History of pre-eclampsia 03/10/2019   Frequent UTI 03/10/2019    Prenatal labs and studies: ABO, Rh: A/Positive/-- (11/22 1416) Antibody: Negative (11/22 1416) Rubella: 16.50 (11/22 1416) RPR: Non Reactive (04/05 1002)  HBsAg: Negative (11/22 1416)  HIV: Non Reactive (11/22 1416)  JKD:TOIZTIWP/-- (05/25 1634)   Past Medical History:  Diagnosis Date   GERD (gastroesophageal reflux disease)    Pyelonephritis affecting pregnancy in first trimester, antepartum      Past Surgical History:  Procedure Laterality Date   CESAREAN SECTION     NO PAST SURGERIES     WISDOM TOOTH EXTRACTION  2019     Medications    Current Discharge Medication List     CONTINUE these medications which have NOT CHANGED   Details  aspirin EC 81 MG tablet Take 1 tablet (81 mg  total) by mouth daily. Swallow whole. Start at 14 weeks Qty: 30 tablet, Refills: 11    calcium carbonate (TUMS - DOSED IN MG ELEMENTAL CALCIUM) 500 MG chewable tablet Chew 1 tablet (200 mg of elemental calcium total) by mouth in the morning, at noon, in the evening, and at bedtime. Qty: 12 tablet, Refills: 0    Magnesium Oxide 420 (252 Mg) MG TABS Take 1 tablet by mouth in the morning and at bedtime. Qty: 60 tablet, Refills: 2    metoCLOPramide (REGLAN) 10 MG tablet Take 1 tablet (10 mg total) by mouth 4 (four) times daily as needed for nausea or vomiting. Qty: 30 tablet, Refills: 2    ondansetron (ZOFRAN ODT) 4 MG disintegrating tablet Take 1 tablet (4 mg total) by mouth every 6 (six) hours as needed for nausea. Qty: 20 tablet, Refills: 0    Prenatal Vit-Fe Fumarate-FA (MULTIVITAMIN-PRENATAL) 27-0.8 MG TABS tablet Take 1 tablet by mouth daily at 12 noon.    promethazine (PHENERGAN) 12.5 MG tablet Take 1 tablet (12.5 mg total) by mouth every 6 (six) hours as needed for nausea or vomiting. Qty: 30 tablet, Refills: 0    albuterol (VENTOLIN HFA) 108 (90 Base) MCG/ACT inhaler Inhale into the lungs.    butalbital-acetaminophen-caffeine (FIORICET) 50-325-40 MG tablet Take 1 tablet by mouth every 6 (six) hours as needed for headache or  migraine. Qty: 14 tablet, Refills: 0         Allergies  Bee venom and Sulfa antibiotics  Review of Systems  Constitutional: negative Eyes: negative Ears, nose, mouth, throat, and face: negative Respiratory: negative Cardiovascular: negative Gastrointestinal: negative Genitourinary:negative Integument/breast: negative Hematologic/lymphatic: negative Musculoskeletal:negative Neurological: negative Behavioral/Psych: negative Endocrine: negative  Physical Exam  BP (!) 148/90 (BP Location: Right Arm)   Pulse 100   Temp 98.6 F (37 C) (Oral)   Resp 16   Ht 5\' 6"  (1.676 m)   Wt 96.2 kg   LMP 01/15/2020 (Exact Date)   BMI 34.22 kg/m    Lungs:  CTA B Cardio: RRR without M/R/G Abd: Soft, gravid, NT Presentation: cephalic EXT: No C/C/ 1+ Edema DTRs: 2+ B CERVIX: Dilation: 5 Effacement (%): 100 Cervical Position: Middle Station: -1, 0 Presentation: Vertex Exam by:: [email or web address] CNM  See Prenatal records for more detailed PE.     FHR:  Baseline: 130 bpm, Variability: Good {> 6 bpm), Accelerations: Reactive, and Decelerations: Absent  Toco: Uterine Contractions: Frequency: Every 2-3 minutes  Test Results  Results for orders placed or performed during the hospital encounter of 10/18/20 (from the past 24 hour(s))  Protein / creatinine ratio, urine     Status: Abnormal   Collection Time: 10/18/20  5:38 AM  Result Value Ref Range   Creatinine, Urine 102 mg/dL   Total Protein, Urine 17 mg/dL   Protein Creatinine Ratio 0.17 (H) 0.00 - 0.15 mg/mg[Cre]  Comprehensive metabolic panel     Status: Abnormal   Collection Time: 10/18/20  6:05 AM  Result Value Ref Range   Sodium 134 (L) 135 - 145 mmol/L   Potassium 3.1 (L) 3.5 - 5.1 mmol/L   Chloride 104 98 - 111 mmol/L   CO2 25 22 - 32 mmol/L   Glucose, Bld 113 (H) 70 - 99 mg/dL   BUN 8 6 - 20 mg/dL   Creatinine, Ser 10/20/20 0.44 - 1.00 mg/dL   Calcium 9.2 8.9 - 9.62 mg/dL   Total Protein 6.5 6.5 - 8.1 g/dL   Albumin 2.9 (L) 3.5 - 5.0 g/dL   AST 18 15 - 41 U/L   ALT 13 0 - 44 U/L   Alkaline Phosphatase 92 38 - 126 U/L   Total Bilirubin 0.5 0.3 - 1.2 mg/dL   GFR, Estimated 95.2 >84 mL/min   Anion gap 5 5 - 15  CBC with Differential/Platelet     Status: Abnormal   Collection Time: 10/18/20  6:05 AM  Result Value Ref Range   WBC 13.2 (H) 4.0 - 10.5 K/uL   RBC 3.65 (L) 3.87 - 5.11 MIL/uL   Hemoglobin 10.8 (L) 12.0 - 15.0 g/dL   HCT 10/20/20 (L) 24.4 - 01.0 %   MCV 87.7 80.0 - 100.0 fL   MCH 29.6 26.0 - 34.0 pg   MCHC 33.8 30.0 - 36.0 g/dL   RDW 27.2 53.6 - 64.4 %   Platelets 253 150 - 400 K/uL   nRBC 0.0 0.0 - 0.2 %   Neutrophils Relative % 78 %   Neutro Abs  10.4 (H) 1.7 - 7.7 K/uL   Lymphocytes Relative 14 %   Lymphs Abs 1.8 0.7 - 4.0 K/uL   Monocytes Relative 6 %   Monocytes Absolute 0.7 0.1 - 1.0 K/uL   Eosinophils Relative 1 %   Eosinophils Absolute 0.1 0.0 - 0.5 K/uL   Basophils Relative 0 %   Basophils Absolute 0.0 0.0 - 0.1 K/uL  Immature Granulocytes 1 %   Abs Immature Granulocytes 0.07 0.00 - 0.07 K/uL   Group B Strep negative  Assessment   G2P0101 at [redacted]w[redacted]d Estimated Date of Delivery: 10/21/20  The fetus is reassuring.   Patient Active Problem List   Diagnosis Date Noted   Labor and delivery, indication for care 10/18/2020   Elevated blood pressure affecting pregnancy in third trimester, antepartum 09/29/2020   Anemia of pregnancy 08/11/2020   Vitreous floaters of both eyes 08/03/2020   Patient desires vaginal birth after cesarean section (VBAC) 05/25/2020   History of cesarean section 02/15/2020   History of preterm delivery 02/15/2020   History of HELLP syndrome, currently pregnant 02/15/2020   Family history of breast cancer in first degree relative 03/10/2019   History of pre-eclampsia 03/10/2019   Frequent UTI 03/10/2019    Plan  1. Admit to L&D :  trial of labor after cesarean section 2. EFM:-- Category 1 3. Stadol or Epidural if desired.   4. Admission labs  5. Anticipate NSVD 6 Dr. Logan Bores notified of pt arrival  Doreene Burke, [place]  10/18/2020 8:00 AM

## 2020-10-18 NOTE — Anesthesia Preprocedure Evaluation (Signed)
Anesthesia Evaluation  Patient identified by MRN, date of birth, ID band Patient awake    Reviewed: Allergy & Precautions, H&P , NPO status , Patient's Chart, lab work & pertinent test results  History of Anesthesia Complications Negative for: history of anesthetic complications  Airway Mallampati: II   Neck ROM: full    Dental no notable dental hx.    Pulmonary    Pulmonary exam normal        Cardiovascular Normal cardiovascular exam     Neuro/Psych    GI/Hepatic GERD  ,  Endo/Other    Renal/GU      Musculoskeletal   Abdominal   Peds  Hematology  (+) Blood dyscrasia, anemia ,   Anesthesia Other Findings   Reproductive/Obstetrics (+) Pregnancy                             Anesthesia Physical Anesthesia Plan  ASA: 2  Anesthesia Plan: Epidural   Post-op Pain Management:    Induction:   PONV Risk Score and Plan:   Airway Management Planned:   Additional Equipment:   Intra-op Plan:   Post-operative Plan:   Informed Consent: I have reviewed the patients History and Physical, chart, labs and discussed the procedure including the risks, benefits and alternatives for the proposed anesthesia with the patient or authorized representative who has indicated his/her understanding and acceptance.       Plan Discussed with: Anesthesiologist  Anesthesia Plan Comments:         Anesthesia Quick Evaluation

## 2020-10-18 NOTE — Progress Notes (Signed)
LABOR NOTE   Anita Hurst 25 y.o.GP@ at [redacted]w[redacted]d  SUBJECTIVE:  Comfortable with epidural.  Analgesia: Epidural  OBJECTIVE:  BP 108/79   Pulse 82   Temp 98.6 F (37 C) (Oral)   Resp 16   Ht 5\' 6"  (1.676 m)   Wt 96.2 kg   LMP 01/15/2020 (Exact Date)   SpO2 98%   BMI 34.22 kg/m  No intake/output data recorded.  She has shown cervical change. CERVIX: 6 cm:  100%:   0:   mid position:   soft SVE:   Dilation: 6 Effacement (%): 100 Station: 0 Exam by:: [email or web address] RN CONTRACTIONS: regular, every 4 minutes FHR: Fetal heart tracing reviewed. Baseline: 130 bpm, Variability: Good {> 6 bpm), Accelerations: Reactive, and Decelerations: Absent Category I    Labs: Lab Results  Component Value Date   WBC 13.2 (H) 10/18/2020   HGB 10.8 (L) 10/18/2020   HCT 32.0 (L) 10/18/2020   MCV 87.7 10/18/2020   PLT 253 10/18/2020    ASSESSMENT: 1) Labor curve reviewed.       Progress: Active phase labor.     Membranes: ruptured, clear fluid            Active Problems:   Labor and delivery, indication for care   PLAN: continue present management, Discussed use of pitocin augmention in 1-2hr if contractions do not increase due to SROM. Pt and her partner verbalizes and agree to plan.   10/20/2020, CNM  10/18/2020 12:03 PM

## 2020-10-18 NOTE — Progress Notes (Signed)
Pt with soreness and tenderness to back. Pt states she feels shooting pains down her back. Pt states multiple attempts at epidural by anesthesia.

## 2020-10-18 NOTE — Progress Notes (Signed)
Notified anesthesia (Dr. Suzan Slick) of pt's back pain. No new orders at this time, does not sound like emergent need for intervention, recommend treat symptomatically with pain meds and ice as needed, plan to reassess in the AM unless anything changes.

## 2020-10-18 NOTE — Progress Notes (Signed)
Pillow in place and ice pack to back

## 2020-10-18 NOTE — Progress Notes (Signed)
LABOR NOTE   Thomasene Dubow 25 y.o.GP@ at [redacted]w[redacted]d  SUBJECTIVE:  Comfortable with epidural, Called by rn PT is completed plus 2.  Analgesia: Epidural  OBJECTIVE:  BP (!) 141/87   Pulse 97   Temp 98.9 F (37.2 C) (Axillary)   Resp 16   Ht 5\' 6"  (1.676 m)   Wt 96.2 kg   LMP 01/15/2020 (Exact Date)   SpO2 98%   BMI 34.22 kg/m  Total I/O In: 949.7 [I.V.:949.7] Out: 400 [Urine:400]  She has shown cervical change. CERVIX: 10 cm/100/+2 SVE:   Dilation: 10 Effacement (%): 100 Station: Plus 2 Exam by:: B [email or web address] RN CONTRACTIONS: regular, every 2 minutes FHR: Fetal heart tracing reviewed. Baseline: 145 bpm, Variability: Good {> 6 bpm), Accelerations: Non-reactive but appropriate for gestational age, and Decelerations: Variable: moderate Category II   Labs: Lab Results  Component Value Date   WBC 13.2 (H) 10/18/2020   HGB 10.8 (L) 10/18/2020   HCT 32.0 (L) 10/18/2020   MCV 87.7 10/18/2020   PLT 253 10/18/2020    ASSESSMENT: 1) Labor curve reviewed.       Progress: Active phase labor.     Membranes: ruptured, clear fluid        Active Problems:   Labor and delivery, indication for care   PLAN: continue present management Start pushing   10/20/2020, Doreene Burke  10/18/2020 5:42 PM

## 2020-10-19 LAB — CBC
HCT: 26.8 % — ABNORMAL LOW (ref 36.0–46.0)
Hemoglobin: 9.1 g/dL — ABNORMAL LOW (ref 12.0–15.0)
MCH: 29.6 pg (ref 26.0–34.0)
MCHC: 34 g/dL (ref 30.0–36.0)
MCV: 87.3 fL (ref 80.0–100.0)
Platelets: 206 10*3/uL (ref 150–400)
RBC: 3.07 MIL/uL — ABNORMAL LOW (ref 3.87–5.11)
RDW: 13.2 % (ref 11.5–15.5)
WBC: 12.1 10*3/uL — ABNORMAL HIGH (ref 4.0–10.5)
nRBC: 0 % (ref 0.0–0.2)

## 2020-10-19 MED ORDER — CYCLOBENZAPRINE HCL 10 MG PO TABS
10.0000 mg | ORAL_TABLET | Freq: Three times a day (TID) | ORAL | Status: DC | PRN
  Filled 2020-10-19: qty 1

## 2020-10-19 MED ORDER — LIDOCAINE 5 % EX PTCH
1.0000 | MEDICATED_PATCH | CUTANEOUS | Status: DC
  Administered 2020-10-19: 1 via TRANSDERMAL
  Filled 2020-10-19 (×2): qty 1

## 2020-10-19 MED ORDER — LABETALOL HCL 200 MG PO TABS
200.0000 mg | ORAL_TABLET | Freq: Two times a day (BID) | ORAL | Status: DC
  Administered 2020-10-19 – 2020-10-20 (×3): 200 mg via ORAL
  Filled 2020-10-19 (×3): qty 1

## 2020-10-19 NOTE — Lactation Note (Signed)
This note was copied from a baby's chart. Lactation Consultation Note  Patient Name: Anita Hurst [id number] Date: 10/19/2020   Age:24 hours  Lactation check-in. Parents report recent of spit up of significant amount that appeared to be colostrum. Dad now holding baby and soothing her, Baby is asleep and appears comfortable. Mom is pumping for stimulation and colostrum collection. Encouraged continued frequent stimulation, spend time skin to skin, and discussed varying feeding methods. Encouraged continued frequent attempts with baby at the breast, and call for ongoing support.   Maternal Data    Feeding    LATCH Score                    Lactation Tools Discussed/Used Tools: 72F feeding tube / Syringe Breast pump type: Double-Electric Breast Pump Pumping frequency: q 3 hrs  Interventions    Discharge    Consult Status      Anita Hurst 10/19/2020, 3:25 PM

## 2020-10-19 NOTE — Progress Notes (Signed)
Progress Note - Vaginal Delivery  Anita Hurst is a 25 y.o. G2P0101 now PP day 1 s/p VBAC, Spontaneous .   Subjective:  The patient reports up ad lib, voiding, tolerating PO, and back pain   Objective:  Vital signs in last 24 hours: Temp:  [97.7 F (36.5 C)-100.9 F (38.3 C)] 97.7 F (36.5 C) (06/16 0326) Pulse Rate:  [82-107] 90 (06/16 0326) Resp:  [16-18] 18 (06/16 0326) BP: (108-147)/(78-103) 133/96 (06/16 0326) SpO2:  [98 %-99 %] 99 % (06/16 0326)  Physical Exam:  General: alert, cooperative, appears stated age, and fatigued Lochia: appropriate Uterine Fundus: firm @ u-1 Back pain due to epidural placement No visual changes, no epigastric pain  2+ reflexes bilaterally.  No change in headaches.   Data Review Recent Labs    10/18/20 0605  HGB 10.8*  HCT 32.0*    Assessment/Plan: Active Problems:   Labor and delivery, indication for care   Plan for discharge tomorrow   -- Continue routine PP care.  Discussed with pt recommend staying until tomorrow to further evaluate blood pressures and she is having som difficulty with breastfeeding. She agrees with plan to be discharged tomorrow.    Doreene Burke, CNM  10/19/2020 5:31 AM

## 2020-10-19 NOTE — Anesthesia Postprocedure Evaluation (Signed)
Anesthesia Post Note  Patient: Anita Hurst  Procedure(s) Performed: AN AD HOC LABOR EPIDURAL  Patient location during evaluation: Mother Baby Anesthesia Type: Epidural Level of consciousness: awake and alert Pain management: pain level controlled Vital Signs Assessment: post-procedure vital signs reviewed and stable Respiratory status: spontaneous breathing, nonlabored ventilation and respiratory function stable Cardiovascular status: stable Postop Assessment: no headache and epidural receding Anesthetic complications: no   No notable events documented.   Last Vitals:  Vitals:   10/19/20 0326 10/19/20 0730  BP: (!) 133/96 (!) 129/91  Pulse: 90 93  Resp: 18 17  Temp: 36.5 C 37 C  SpO2: 99% 99%    Last Pain:  Vitals:   10/19/20 0735  TempSrc:   PainSc: 7                  Anita Hurst [address]

## 2020-10-19 NOTE — Progress Notes (Signed)
Pt reports improvement in back pain after using ice pack and PRN percocet x 1. Stated pain has gone from sharp shooting pain to dull ache.

## 2020-10-19 NOTE — Lactation Note (Signed)
This note was copied from a baby's chart. Lactation Consultation Note  Patient Name: Anita Hurst Agent [id number] Date: 10/19/2020 Reason for consult: Follow-up assessment;Mother's request;Term Age:24 hours  Lactation called into room for assist with attempted feeding. Baby did appear alert and rooting, but agitated.  Wet diaper changed by LC.  Baby brought into laid back position for latching, attempts to latch and sustain latch were unsuccessful, multiple attempts also made in football hold on L breast, unable to sustain latch. Digit assessment performed: baby did appear to have a tighter upper lip, blanching of gum when raised, however did have a rhythmic suck pattern, with some tongue thrusting.  LC implemented size 41mm nipple shield. Baby accepted shield after 3 attempts, tongue thrusting still evident- colostrum placed in shield and baby tolerated well. Baby on/off breast for another 5 minutes.  At this time mom given the option to continue efforts at the breast or syringe/finger feed her expressed colostrum from earlier; mom opts to have LC finger feed baby. Baby tolerated intake of 21mL before becoming sleepy.   Baby wrapped and swaddled, soothed and placed in bassinet. Mom set-up with her pump parts/pieces and began pumping.  Mom encouraged to finish pumping and to rest if possible; support person on way, mom to call out if anything is needed and for next feeding attempt. Maternal Data Has patient been taught Hand Expression?: Yes Does the patient have breastfeeding experience prior to this delivery?: Yes How long did the patient breastfeed?: 7-54months  Feeding Mother's Current Feeding Choice: Breast Milk  LATCH Score Latch: Repeated attempts needed to sustain latch, nipple held in mouth throughout feeding, stimulation needed to elicit sucking reflex.  Audible Swallowing: A few with stimulation (with colostrum placed in shield)  Type of Nipple: Everted at rest and after  stimulation  Comfort (Breast/Nipple): Soft / non-tender  Hold (Positioning): No assistance needed to correctly position infant at breast.  LATCH Score: 8   Lactation Tools Discussed/Used Tools: 27F feeding tube / Syringe Nipple shield size: 24 (better latch w/ shield) Breast pump type: Double-Electric Breast Pump Pump Education: Setup, frequency, and cleaning;Milk Storage Reason for Pumping: stimulation Pumping frequency: q 3 hrs  Interventions Interventions: Breast feeding basics reviewed;Assisted with latch;Hand express;Adjust position;Support pillows;Position options;Expressed milk;Education;DEBP  Discharge Pump: Personal (Medela)  Consult Status Consult Status: Follow-up Date: 10/19/20 Follow-up type: In-patient    Danford Bad 10/19/2020, 12:24 PM

## 2020-10-19 NOTE — Lactation Note (Signed)
This note was copied from a baby's chart. Lactation Consultation Note  Patient Name: Anita Hurst [id number] Date: 10/19/2020 Reason for consult: Initial assessment;Term Age:25 years  Initial lactation visit. Mom is G2P2, VBAC 24 hours ago. First baby was preterm at 32 weeks, moms history includes exclusive pumping and bottle feeding for 7-8 months. Mom desires breastfeeding with this baby, along with pumping.  Baby fed after delivery, but has been sleepy, gaggy/spitty and uninterested in feeding since. Multiple efforts have been made to get baby to eat, but have all been unsuccessful.   LC at bedside to assist with attempted feeding. Baby quietly alert and awake, brought to mom in football hold at R breast. Baby would open mouth but then close quickly, would latch but give 2-3 sucks before letting go and laying quietly. After 10 minutes feeding attempt was discontinued.   LC and mom discussed pumping for stimulation and colostrum collection until baby is more eager and interested in eating. DEBP set-up at bedside, mom fitted with size 36mm flanges and LC present throughout pumping session. 51mL colostrum collected. Discussed feeding plan/options for use of colostrum- gloved finger with syringe if needed, continued efforts of putting baby to breast and pumping of every 3 hours. Reviewed cleaning of parts and pieces and milk storage.   Grandmother present at end of [facility] visit, encouraged mom to rest if possible, LC will check in later.  Maternal Data Has patient been taught Hand Expression?: Yes Does the patient have breastfeeding experience prior to this delivery?: Yes How long did the patient breastfeed?: 7-36months  Feeding Mother's Current Feeding Choice: Breast Milk Nipple Type: Slow - flow  LATCH Score Latch: Repeated attempts needed to sustain latch, nipple held in mouth throughout feeding, stimulation needed to elicit sucking reflex.  Audible Swallowing: None  Type of Nipple:  Everted at rest and after stimulation  Comfort (Breast/Nipple): Soft / non-tender  Hold (Positioning): No assistance needed to correctly position infant at breast.  LATCH Score: 7   Lactation Tools Discussed/Used Tools: Pump;Coconut oil Breast pump type: Double-Electric Breast Pump Pump Education: Setup, frequency, and cleaning;Milk Storage Reason for Pumping: stimulation Pumping frequency: q 3 hrs  Interventions Interventions: Breast feeding basics reviewed;Assisted with latch;Hand express;Adjust position;Support pillows;Position options;DEBP;Coconut oil;Education  Discharge Pump: Personal (Medela)  Consult Status Consult Status: Follow-up Date: 10/19/20 Follow-up type: In-patient    Danford Bad 10/19/2020, 10:21 AM

## 2020-10-20 ENCOUNTER — Other Ambulatory Visit: Payer: Self-pay

## 2020-10-20 MED ORDER — IBUPROFEN 600 MG PO TABS
600.0000 mg | ORAL_TABLET | Freq: Four times a day (QID) | ORAL | Status: DC
  Administered 2020-10-20: 600 mg via ORAL
  Filled 2020-10-20: qty 1

## 2020-10-20 MED ORDER — IBUPROFEN 600 MG PO TABS
600.0000 mg | ORAL_TABLET | Freq: Four times a day (QID) | ORAL | 0 refills | Status: DC
  Filled 2020-10-20: qty 60, 15d supply, fill #0

## 2020-10-20 MED ORDER — LABETALOL HCL 200 MG PO TABS
200.0000 mg | ORAL_TABLET | Freq: Two times a day (BID) | ORAL | 1 refills | Status: DC
  Filled 2020-10-20: qty 60, 30d supply, fill #0

## 2020-10-20 MED ORDER — FERROUS SULFATE 325 (65 FE) MG PO TABS
325.0000 mg | ORAL_TABLET | Freq: Every day | ORAL | 0 refills | Status: DC
Start: 2020-10-21 — End: 2021-03-02
  Filled 2020-10-20: qty 60, 60d supply, fill #0

## 2020-10-20 MED ORDER — DOCUSATE SODIUM 100 MG PO CAPS
100.0000 mg | ORAL_CAPSULE | Freq: Two times a day (BID) | ORAL | 2 refills | Status: DC | PRN
  Filled 2020-10-20: qty 30, 15d supply, fill #0

## 2020-10-20 NOTE — Lactation Note (Signed)
This note was copied from a baby's chart. Lactation Consultation Note  Patient Name: Girl Suzzanne Brunkhorst [id number] Date: 10/20/2020 Reason for consult: Follow-up assessment;Term Age:24 hours  Maternal Data Has patient been taught Hand Expression?: Yes Does the patient have breastfeeding experience prior to this delivery?: Yes How long did the patient breastfeed?: 7-8 mths (pumped and bottlefed)  Feeding Mother's Current Feeding Choice: Breast Milk Nipple Type: Slow - flow  LATCH Score Latch:  (no feeding observed)                  Lactation Tools Discussed/Used  Given medela nipple shield instrudction sheet  Interventions    Discharge    Consult Status Consult Status: PRN Date: 10/20/20 Follow-up type: In-patient    Dyann Kief 10/20/2020, 11:57 AM

## 2020-10-20 NOTE — Discharge Summary (Signed)
Postpartum Discharge Summary     Patient Name: Anita Hurst DOB: 1996/04/16 MRN: [id number]  Date of admission: 10/18/2020 Delivery date:10/18/2020  Delivering provider: Philip Aspen  Date of discharge: 10/20/2020  Admitting diagnosis: Labor and delivery, indication for care [O75.9] Intrauterine pregnancy: [redacted]w[redacted]d    Secondary diagnosis:  Active Problems:   History of pre-eclampsia   History of cesarean section   History of preterm delivery   History of HELLP syndrome, currently pregnant   Patient desires vaginal birth after cesarean section (VBAC)   Elevated blood pressure affecting pregnancy in third trimester, antepartum   Labor and delivery, indication for care  Additional problems: None    Discharge diagnosis: Term Pregnancy Delivered (VBAC) and Gestational Hypertension                                              Post partum procedures: None Augmentation: AROM Complications: None  Hospital course: Onset of Labor With Vaginal Delivery      25y.o. yo [id number] admitted in Active Labor on 10/18/2020. Patient had an uncomplicated labor course as follows:  Membrane Rupture Time/Date: 11:48 AM ,10/18/2020   Delivery Method:VBAC, Spontaneous  Episiotomy: None  Lacerations:  Periurethral;1st degree;Perineal  Patient had an uncomplicated postpartum course.  She is ambulating, tolerating a regular diet, passing flatus, and urinating well. Patient is discharged home in stable condition on 10/20/20.  Newborn Data: Birth date:10/18/2020  Birth time:5:15 PM  Gender:Female  Living status:Living  Apgars:8 ,9  Weight:3740 g   Magnesium Sulfate received: No BMZ received: No Rhophylac:No MMR:Yes T-DaP: given prenatally Flu: No Transfusion:No  Physical exam  Vitals:   10/19/20 2311 10/20/20 0312 10/20/20 0727 10/20/20 1105  BP: 132/78 124/78 125/82 131/88  Pulse: (!) 103 (!) 102 99 (!) 101  Resp: 18 18 18    Temp: 98.4 F (36.9 C) 97.9 F (36.6 C)  97.8 F (36.6 C)   TempSrc: Oral Oral Oral   SpO2: 99% 98% 100%   Weight:      Height:       General: alert, cooperative, and no distress Lochia: appropriate Uterine Fundus: firm Incision: N/A DVT Evaluation: No evidence of DVT seen on physical exam. Negative Homan's sign. No cords or calf tenderness. No significant calf/ankle edema.   Labs: Lab Results  Component Value Date   WBC 12.1 (H) 10/19/2020   HGB 9.1 (L) 10/19/2020   HCT 26.8 (L) 10/19/2020   MCV 87.3 10/19/2020   PLT 206 10/19/2020    CMP Latest Ref Rng & Units 10/18/2020  Glucose 70 - 99 mg/dL 113(H)  BUN 6 - 20 mg/dL 8  Creatinine 0.44 - 1.00 mg/dL 0.71  Sodium 135 - 145 mmol/L 134(L)  Potassium 3.5 - 5.1 mmol/L 3.1(L)  Chloride 98 - 111 mmol/L 104  CO2 22 - 32 mmol/L 25  Calcium 8.9 - 10.3 mg/dL 9.2  Total Protein 6.5 - 8.1 g/dL 6.5  Total Bilirubin 0.3 - 1.2 mg/dL 0.5  Alkaline Phos 38 - 126 U/L 92  AST 15 - 41 U/L 18  ALT 0 - 44 U/L 13    Edinburgh Score: Edinburgh Postnatal Depression Scale Screening Tool 10/19/2020  I have been able to laugh and see the funny side of things. 0  I have looked forward with enjoyment to things. 0  I have blamed myself unnecessarily when  things went wrong. 1  I have been anxious or worried for no good reason. 2  I have felt scared or panicky for no good reason. 1  Things have been getting on top of me. 0  I have been so unhappy that I have had difficulty sleeping. 0  I have felt sad or miserable. 0  I have been so unhappy that I have been crying. 0  The thought of harming myself has occurred to me. 0  Edinburgh Postnatal Depression Scale Total 4      After visit meds:  Allergies as of 10/20/2020       Reactions   Bee Venom Hives, Rash   Sulfa Antibiotics Hives, Rash        Medication List     STOP taking these medications    aspirin EC 81 MG tablet   butalbital-acetaminophen-caffeine 50-325-40 MG tablet Commonly known as: FIORICET   calcium  carbonate 500 MG chewable tablet Commonly known as: TUMS - dosed in mg elemental calcium   Magnesium Oxide 420 (252 Mg) MG Tabs   metoCLOPramide 10 MG tablet Commonly known as: REGLAN   ondansetron 4 MG disintegrating tablet Commonly known as: Zofran ODT   promethazine 12.5 MG tablet Commonly known as: PHENERGAN       TAKE these medications    albuterol 108 (90 Base) MCG/ACT inhaler Commonly known as: VENTOLIN HFA Inhale into the lungs.   docusate sodium 100 MG capsule Commonly known as: COLACE Take 1 capsule (100 mg total) by mouth 2 (two) times daily as needed.   ferrous sulfate 325 (65 FE) MG tablet Take 1 tablet (325 mg total) by mouth daily with breakfast. Start taking on: October 21, 2020   ibuprofen 600 MG tablet Commonly known as: ADVIL Take 1 tablet (600 mg total) by mouth every 6 (six) hours.   labetalol 200 MG tablet Commonly known as: NORMODYNE Take 1 tablet (200 mg total) by mouth 2 (two) times daily.   multivitamin-prenatal 27-0.8 MG Tabs tablet Take 1 tablet by mouth daily at 12 noon.         Discharge home in stable condition Infant Feeding: Breast Infant Disposition:home with mother Discharge instruction: per After Visit Summary and Postpartum booklet. Activity: Advance as tolerated. Pelvic rest for 6 weeks.  Diet: routine diet Anticipated Birth Control: Depo Provera Postpartum Appointment:6 weeks Additional Postpartum F/U: Postpartum Depression checkup (2 week televisit) and BP check 2-3 days  Future Appointments: Future Appointments  Date Time Provider [place]  10/23/2020 10:15 AM [facility] NST ROOM [facility] None  10/23/2020 11:15 AM Philip Aspen, CNM [facility] None   Follow up Visit:  Follow-up Information     Philip Aspen, CNM Follow up.   Specialties: Certified Nurse Midwife, Radiology Why: 3-5 days for BP check 2 week televisit for postpartum mood checke 6 weeks postpartum visit (in person) Contact information: [place] [address] [phone]                     10/20/2020 Rubie Maid, MD [facility]

## 2020-10-20 NOTE — Consult Note (Signed)
Brief Pharmacy Note  Pharmacy has been consulted for our anti-hypertensive meds to beds program.   Patient has agreed to the program, and meds have been delivered bedside - pt counseled.  Thank you for including pharmacy in this patient's care.  Albina Billet, Clinical Pharmacist 10/20/2020 2:02 PM

## 2020-10-20 NOTE — Progress Notes (Signed)
Patient discharged home with infant. Rx received by patient from Meds-to-bed pharmacy. PP instructions given and reviewed with patient. Patient verbalized understanding. Patient escorted out by auxiliary.   Peter Minium 10/20/2020 2:09 PM

## 2020-10-20 NOTE — Progress Notes (Signed)
Post Partum Day # 2, s/p SVD  Subjective: no complaints, up ad lib, voiding, and tolerating PO  Objective: Temp:  [97.8 F (36.6 C)-98.4 F (36.9 C)] 97.8 F (36.6 C) (06/17 0727) Pulse Rate:  [93-103] 101 (06/17 1105) Resp:  [18] 18 (06/17 0727) BP: (118-132)/(77-88) 131/88 (06/17 1105) SpO2:  [98 %-100 %] 100 % (06/17 0727)  Physical Exam:  General: alert and no distress  Lungs: clear to auscultation bilaterally Breasts: normal appearance, no masses or tenderness Heart: regular rate and rhythm, S1, S2 normal, no murmur, click, rub or gallop Abdomen: soft, non-tender; bowel sounds normal; no masses,  no organomegaly Pelvis: Lochia: appropriate, Uterine Fundus: firm Extremities: DVT Evaluation: No evidence of DVT seen on physical exam. Negative Homan's sign. No cords or calf tenderness. No significant calf/ankle edema.   Recent Labs    10/18/20 0605 10/19/20 0707  HGB 10.8* 9.1*  HCT 32.0* 26.8*    Assessment/Plan: Overall doing well postpartum Breastfeeding Contraception Depo Provera Blood pressures more stable today. Iniitated on Labetalol 200 mg BID. Will have patient f/u for BP check in the office within a week.  Anemia of pregnancy, asymptomatic. Will encourage daily iron supplementation.  Discharge home   LOS: 2 days   Hildred Laser, MD [facility] 10/20/2020 12:25 PM

## 2020-10-23 ENCOUNTER — Other Ambulatory Visit: Payer: Self-pay

## 2020-10-23 ENCOUNTER — Ambulatory Visit: Payer: Managed Care, Other (non HMO) | Admitting: Certified Nurse Midwife

## 2020-10-23 ENCOUNTER — Other Ambulatory Visit: Payer: Managed Care, Other (non HMO)

## 2020-10-23 ENCOUNTER — Ambulatory Visit (INDEPENDENT_AMBULATORY_CARE_PROVIDER_SITE_OTHER): Payer: Managed Care, Other (non HMO) | Admitting: Certified Nurse Midwife

## 2020-10-23 VITALS — BP 117/81 | HR 101 | Ht 66.0 in | Wt 201.0 lb

## 2020-10-23 DIAGNOSIS — Z0130 Encounter for examination of blood pressure without abnormal findings: Secondary | ICD-10-CM

## 2020-10-23 NOTE — Progress Notes (Signed)
G2 P2 1 wk post partum Blood pressure check , pt started on labetalol prior to discharge from the hospital. She states her Bps have been stable at home . BP today 117/81. She denies any symptoms pre eclampsia. Pt encouraged to continue labetalol as ordered . We will re evaluate at her 6 wk pp visit. She verbalizes and agrees. Follow up 1 wk tele visit for mood check, 5 wks for in office PPV.   Doreene Burke, CNM

## 2020-10-24 NOTE — Progress Notes (Signed)
G2 P2 1 wk post partum Blood pressure check , pt started on labetalol prior to discharge from the hospital. She states her Bps have been stable at home . BP today 117/81. She denies any symptoms pre eclampsia. Pt encouraged to continue labetalol as ordered . We will re evaluate at her 6 wk pp visit. She verbalizes and agrees. Follow up 1 wk tele visit for mood check, 5 wks for in office PPV.   Yarithza Mink, CNM  

## 2020-10-31 ENCOUNTER — Other Ambulatory Visit: Payer: Self-pay

## 2020-10-31 ENCOUNTER — Ambulatory Visit (INDEPENDENT_AMBULATORY_CARE_PROVIDER_SITE_OTHER): Payer: Managed Care, Other (non HMO) | Admitting: Certified Nurse Midwife

## 2020-10-31 ENCOUNTER — Encounter: Payer: Self-pay | Admitting: Certified Nurse Midwife

## 2020-10-31 DIAGNOSIS — N898 Other specified noninflammatory disorders of vagina: Secondary | ICD-10-CM

## 2020-10-31 MED ORDER — MEDROXYPROGESTERONE ACETATE 150 MG/ML IM SUSP: 150.0000 mg | Freq: Once | INTRAMUSCULAR | 3 refills | Status: DC

## 2020-10-31 NOTE — Progress Notes (Signed)
Virtual Visit via Telephone Note  I connected with Sharetta Ricchio on 10/31/20 at  4:00 PM EDT by telephone and verified that I am speaking with the correct person using two identifiers.  Location: Patient: at home Provider: at the office   I discussed the limitations, risks, security and privacy concerns of performing an evaluation and management service by telephone and the availability of in person appointments. I also discussed with the patient that there may be a patient responsible charge related to this service. The patient expressed understanding and agreed to proceed.   History of Present Illness: 2 week status post VBAC. Started on labetalol postpartum. States her home BP have been 120's/80-90. She states she is still bleeding but notes that it is declining. Pain is well controlled. States she is pumping and is having a little nipple soreness that she is using coconut oil to treat. She states that her mood is good. She asks about depo injection, would like her first does at her 6 wk visit.    Observations/Objective: Garment/textile technologist Visit from 10/31/2020 in [facility]  PHQ-9 Total Score 2        Assessment and Plan: Doing well. Continue with labetalol medication. Orders placed for depo injection. She denies contraindication to use.   Follow Up Instructions: In office 4 wk for pp check up    I discussed the assessment and treatment plan with the patient. The patient was provided an opportunity to ask questions and all were answered. The patient agreed with the plan and demonstrated an understanding of the instructions.   The patient was advised to call back or seek an in-person evaluation if the symptoms worsen or if the condition fails to improve as anticipated.  I provided 7 minutes of non-face-to-face time during this encounter.   Doreene Burke, CNM

## 2020-10-31 NOTE — Progress Notes (Signed)
Pt presents for 2 week PPV tele visit. Pt is breastfeeding & pumping. No intercourse. Would like to discuss injection contraceptive options. Pt is having some bleeding, using about 5-6 pads per day per pt.

## 2020-10-31 NOTE — Patient Instructions (Signed)
Postpartum Baby Blues The postpartum period begins right after the birth of a baby. During this time, there is often joy and excitement. It is also a time of many changes in the life of the parents. A mother may feel happy one minute and sad or stressed the next. These feelings of sadness, called the baby blues, usually happen in theperiod right after the baby is born and go away within a week or two. What are the causes? The exact cause of this condition is not known. Changes in hormone levels afterchildbirth are believed to trigger some of the symptoms. Other factors that can play a role in these mood changes include: Lack of sleep. Stressful life events, such as financial problems, caring for a loved one, or death of a loved one. Genetics. What are the signs or symptoms? Symptoms of this condition include: Changes in mood, such as going from extreme happiness to sadness. A decrease in concentration. Difficulty sleeping. Crying spells and tearfulness. Loss of appetite. Irritability. Anxiety. If these symptoms last for more than 2 weeks or become more severe, you mayhave postpartum depression. How is this diagnosed? This condition is diagnosed based on an evaluation of your symptoms. Your health care provider may use a screening tool that includes a list of questionsto help identify a person with the baby blues or postpartum depression. How is this treated? The baby blues usually go away on their own in 1-2 weeks. Social support isoften what is needed. You will be encouraged to get adequate sleep and rest. Follow these instructions at home: Lifestyle     Get as much rest as you can. Take a nap when the baby sleeps. Exercise regularly as told by your health care provider. Some women find yoga and walking to be helpful. Eat a balanced and nourishing diet. This includes plenty of fruits and vegetables, whole grains, and lean proteins. Do little things that you enjoy. Take a bubble bath,  read your favorite magazine, or listen to your favorite music. Avoid alcohol. Ask for help with household chores, cooking, grocery shopping, or running errands. Do not try to do everything yourself. Consider hiring a postpartum doula to help. This is a professional who specializes in providing support to new mothers. Try not to make any major life changes during pregnancy or right after giving birth. This can add stress. General instructions Talk to people close to you about how you are feeling. Get support from your partner, family members, friends, or other new moms. You may want to join a support group. Find ways to manage stress. This may include: Writing your thoughts and feelings in a journal. Spending time outside. Spending time with people who make you laugh. Try to stay positive in how you think. Think about the things you are grateful for. Take over-the-counter and prescription medicines only as told by your health care provider. Let your health care provider know if you have any concerns. Keep all postpartum visits. This is important. Contact a health care provider if: Your baby blues do not go away after 2 weeks. Get help right away if: You have thoughts of taking your own life (suicidal thoughts), or of harming your baby or someone else. You see or hear things that are not there (hallucinations). If you ever feel like you may hurt yourself or others, or have thoughts about taking your own life, get help right away. Go to your nearest emergency department or: Call your local emergency services (911 in the [place] Call a   suicide crisis helpline, such as the National Suicide Prevention Lifeline, at 1-800-273-8255. This is open 24 hours a day in the [place] Text the Crisis Text Line at 741741 (in the [place] Summary After giving birth, you may feel happy one minute and sad or stressed the next. Feelings of sadness that happen right after the baby is born and go away after a week or two  are called the baby blues. You can manage the baby blues by getting enough rest, eating a healthy diet, exercising, spending time with supportive people, and finding ways to manage stress. If feelings of sadness and stress last longer than 2 weeks or get in the way of caring for your baby, talk with your health care provider. This may mean you have postpartum depression. This information is not intended to replace advice given to you by your health care provider. Make sure you discuss any questions you have with your healthcare provider. Document Revised: 10/15/2019 Document Reviewed: 10/15/2019 Elsevier Patient Education  2022 [employer]  

## 2020-11-29 ENCOUNTER — Encounter: Payer: Self-pay | Admitting: Certified Nurse Midwife

## 2020-11-29 ENCOUNTER — Ambulatory Visit (INDEPENDENT_AMBULATORY_CARE_PROVIDER_SITE_OTHER): Payer: Managed Care, Other (non HMO) | Admitting: Certified Nurse Midwife

## 2020-11-29 ENCOUNTER — Other Ambulatory Visit: Payer: Self-pay

## 2020-11-29 MED ORDER — MEDROXYPROGESTERONE ACETATE 150 MG/ML IM SUSP: 150.0000 mg | Freq: Once | INTRAMUSCULAR | 3 refills | Status: DC

## 2020-11-29 NOTE — Patient Instructions (Signed)
Contraceptive Injection A contraceptive injection is a shot that prevents pregnancy. It is also called a birth control shot. The shot contains the hormone progestin, which prevents pregnancy by: Stopping the ovaries from releasing eggs. Thickening cervical mucus to prevent sperm from entering the cervix. Thinning the lining of the uterus to prevent a fertilized egg from attaching to the uterus. Contraceptive injections are given under the skin (subcutaneous) or into a muscle (intramuscular). For these shots to work, you must get one of them every 3 months (12-13 weeks) from a health care provider. Tell a health care provider about: Any allergies you have. All medicines you are taking, including vitamins, herbs, eye drops, creams, and over-the-counter medicines. Any blood disorders you have. Any medical conditions you have. Whether you are pregnant or may be pregnant. What are the risks? Generally, this is a safe procedure. However, problems may occur, including: Mood changes or depression. Loss of bone density (osteoporosis) after long-term use. Blood clots. This is rare. Higher risk of an egg being fertilized outside your uterus (ectopic pregnancy).This is rare. What happens before the procedure? Your health care provider may do a routine physical exam. You may have a test to make sure you are not pregnant. What happens during the procedure?  The area where the shot will be given will be cleaned and sanitized with alcohol. A needle will be inserted into a muscle in your upper arm or buttock, or into the skin of your thigh or abdomen. The needle will be attached to a syringe with the medicine inside of it. The medicine will be pushed through the syringe and injected into your body. A small bandage (dressing) may be placed over the injection site. What can I expect after the procedure? After the procedure, it is common to have: Soreness around the injection site for a couple of  days. Irregular menstrual bleeding. Weight gain. Breast tenderness. Headaches. Discomfort in your abdomen. Ask your health care provider whether you need to use an added method of birth control (backup contraception), such as a condom, sponge, or spermicide. If the first shot is given 1-7 days after the start of your last menstrual period, you will not need backup contraception. If the first shot is given at any other time during your menstrual cycle, you should avoid having sex. If you do have sex, you will need to use backup contraception for 7 days after you receive the shot. Follow these instructions at home: General instructions Take over-the-counter and prescription medicines only as told by your health care provider. Do not rub or massage the injection site. Track your menstrual periods so you will know if they become irregular. Always use a condom to protect against sexually transmitted infections (STIs). Make sure you schedule an appointment in time for your next shot and mark it on your calendar. You must get an injection every 3 months (12-13 weeks) to prevent pregnancy. Lifestyle Do not use any products that contain nicotine or tobacco. These products include cigarettes, chewing tobacco, and vaping devices, such as e-cigarettes. If you need help quitting, ask your health care provider. Eat foods that are high in calcium and vitamin D, such as milk, cheese, and salmon. Doing this may help with any loss in bone density caused by the contraceptive injection. Ask your health care provider for dietary recommendations. Contact a health care provider if you: Have nausea or vomiting. Have abnormal vaginal discharge or bleeding. Miss a menstrual period or think you might be pregnant. Experience mood changes   or depression. Feel dizzy or light-headed. Have leg pain. Get help right away if you: Have chest pain or cough up blood. Have shortness of breath. Have a severe headache that does  not go away. Have numbness in any part of your body. Have slurred speech or vision problems. Have vaginal bleeding that is abnormally heavy or does not stop, or you have severe pain in your abdomen. Have depression that does not get better with treatment. If you ever feel like you may hurt yourself or others, or have thoughts about taking your own life, get help right away. Go to your nearest emergency department or: Call your local emergency services (911 in the [place] Call a suicide crisis helpline, such as the National Suicide Prevention Lifeline at 1-800-273-8255. This is open 24 hours a day in the [place] Text the Crisis Text Line at 741741 (in the [place] Summary A contraceptive injection is a shot that prevents pregnancy. It is also called the birth control shot. The shot is given under the skin (subcutaneous) or into a muscle (intramuscular). After this procedure, it is common to have soreness around the injection site for a couple of days. To prevent pregnancy, the shot must be given by a health care provider every 3 months (12-13 weeks). After you have the shot, ask your health care provider whether you need to use an added method of birth control (backup contraception), such as a condom, sponge, or spermicide. This information is not intended to replace advice given to you by your health care provider. Make sure you discuss any questions you have with your health care provider. Document Revised: 11/01/2019 Document Reviewed: 11/01/2019 Elsevier Patient Education  2022 Elsevier Inc.  

## 2020-11-29 NOTE — Progress Notes (Signed)
Subjective:    Anita Hurst is a 25 y.o. G9P1102 Caucasian female who presents for a postpartum visit. She is 6 weeks postpartum following a spontaneous vaginal delivery at 39.4 gestational weeks. Anesthesia: epidural. I have fully reviewed the prenatal and intrapartum course. Postpartum course has been complicated with hypertension. She has been on labetalol.She stopped 5 days ago due to recurring dizziness. Her BP today is stable  Baby's course has been WNL. Baby is feeding by  breast milk  . Bleeding no bleeding. Bowel function is normal. Bladder function is normal. Patient is not sexually active. Last sexual activity: prior to delivery. Contraception method is Depo-Provera injections. Postpartum depression screening: negative. Score 4.  Last pap 03/10/2019 and was negative.  The following portions of the patient's history were reviewed and updated as appropriate: allergies, current medications, past medical history, past surgical history and problem list.  Review of Systems Pertinent items are noted in HPI.   Vitals:   11/29/20 0838  BP: 119/82  Pulse: 92  Weight: 183 lb 4.8 oz (83.1 kg)  Height: 5\' 6"  (1.676 m)   No LMP recorded.  Objective:   General:  alert, cooperative and no distress   Breasts:  deferred, no complaints  Lungs: clear to auscultation bilaterally  Heart:  regular rate and rhythm  Abdomen: soft, nontender   Vulva: normal  Vagina: normal vagina  Cervix:  closed  Corpus: Well-involuted  Adnexa:  Non-palpable  Rectal Exam: no hemorrhoids        Assessment:   Postpartum exam 6 wks s/p SVD Breast milk feeding Depression screening Contraception counseling   Plan:  : Depo-Provera injections Follow up in: 1 week for depo injection  or earlier if needed  Melody , CNM

## 2020-12-08 ENCOUNTER — Ambulatory Visit (INDEPENDENT_AMBULATORY_CARE_PROVIDER_SITE_OTHER): Payer: Managed Care, Other (non HMO) | Admitting: Certified Nurse Midwife

## 2020-12-08 ENCOUNTER — Other Ambulatory Visit: Payer: Self-pay

## 2020-12-08 VITALS — BP 116/82 | HR 114 | Ht 66.0 in | Wt 180.8 lb

## 2020-12-08 DIAGNOSIS — Z3042 Encounter for surveillance of injectable contraceptive: Secondary | ICD-10-CM | POA: Diagnosis not present

## 2020-12-08 MED ORDER — MEDROXYPROGESTERONE ACETATE 150 MG/ML IM SUSP
150.0000 mg | Freq: Once | INTRAMUSCULAR | Status: AC
  Administered 2020-12-08: 150 mg via INTRAMUSCULAR

## 2020-12-08 NOTE — Progress Notes (Signed)
Pt presents for first Depo injection. Patient stated she took a Plan-B this past Sunday and has had her period since. Ok' d from [place] that there were no contraindications for plan-B and depo and that it was ok to administer depo today. Patient advised to use a backup method of protection for birth control for the next few weeks, patient agrees to plan and voiced no further questions or concerns.

## 2021-01-16 ENCOUNTER — Telehealth: Payer: Self-pay | Admitting: Certified Nurse Midwife

## 2021-01-16 NOTE — Telephone Encounter (Signed)
Called patient to let her know that it is ok to get her flu vaccine. Advised by Dr. Valentino Saxon.

## 2021-01-16 NOTE — Telephone Encounter (Signed)
Anita Hurst called in and states her job is offering free flu vaccinations this Thursday.  Anita Hurst is interested but stated she is 3 months post partum and still breastfeeding and wants to know if this is safe to obtain the vaccination on Thursday?  Please advise.

## 2021-02-22 ENCOUNTER — Other Ambulatory Visit: Payer: Self-pay

## 2021-03-01 ENCOUNTER — Ambulatory Visit: Payer: Managed Care, Other (non HMO)

## 2021-03-02 ENCOUNTER — Other Ambulatory Visit: Payer: Self-pay

## 2021-03-02 ENCOUNTER — Ambulatory Visit (INDEPENDENT_AMBULATORY_CARE_PROVIDER_SITE_OTHER): Payer: Managed Care, Other (non HMO)

## 2021-03-02 VITALS — Ht 66.0 in

## 2021-03-02 DIAGNOSIS — Z3042 Encounter for surveillance of injectable contraceptive: Secondary | ICD-10-CM | POA: Diagnosis not present

## 2021-03-02 MED ORDER — MEDROXYPROGESTERONE ACETATE 150 MG/ML IM SUSP
150.0000 mg | Freq: Once | INTRAMUSCULAR | Status: AC
  Administered 2021-03-02: 150 mg via INTRAMUSCULAR

## 2021-03-02 NOTE — Progress Notes (Signed)
Date last pap: 03/10/2019. Last Depo-Provera: 12/08/2020. Side Effects if any: none. Serum HCG indicated? N/A. Depo-Provera 150 mg IM given by: Shawn Route, LPN. Next appointment due 05/18/2021-06/01/2021.  Pt supplied depo injection

## 2021-05-24 ENCOUNTER — Other Ambulatory Visit: Payer: Self-pay

## 2021-05-24 ENCOUNTER — Ambulatory Visit: Payer: Managed Care, Other (non HMO)

## 2021-05-24 ENCOUNTER — Telehealth: Payer: Self-pay | Admitting: Certified Nurse Midwife

## 2021-05-24 MED ORDER — MEDROXYPROGESTERONE ACETATE 150 MG/ML IM SUSP: 150.0000 mg | Freq: Once | INTRAMUSCULAR | 0 refills | Status: DC

## 2021-05-24 NOTE — Telephone Encounter (Signed)
Rx sent 

## 2021-05-24 NOTE — Telephone Encounter (Signed)
Pt called she has a depo injection scheduled for tomorrow- she called pharmacy there is no RX- she is asking if RX can be sent today so she can pick up today, Pharmacy is [facility] [place]. Please Advise.

## 2021-05-25 ENCOUNTER — Other Ambulatory Visit: Payer: Self-pay

## 2021-05-25 ENCOUNTER — Ambulatory Visit (INDEPENDENT_AMBULATORY_CARE_PROVIDER_SITE_OTHER): Payer: Managed Care, Other (non HMO) | Admitting: Certified Nurse Midwife

## 2021-05-25 DIAGNOSIS — Z3042 Encounter for surveillance of injectable contraceptive: Secondary | ICD-10-CM

## 2021-05-25 MED ORDER — MEDROXYPROGESTERONE ACETATE 150 MG/ML IM SUSP
150.0000 mg | INTRAMUSCULAR | Status: AC
  Administered 2021-05-25 – 2022-07-23 (×3): 150 mg via INTRAMUSCULAR

## 2021-05-25 NOTE — Progress Notes (Signed)
Date last pap: 03/10/2019. Last Depo-Provera: 12/08/2020. Side Effects if any: none. Serum HCG indicated? N/A. Depo-Provera 150 mg IM given by: Cristy Folks, CMA Next appointment due: April 6 - April 20

## 2021-08-16 ENCOUNTER — Other Ambulatory Visit: Payer: Self-pay

## 2021-08-16 ENCOUNTER — Telehealth: Payer: Self-pay | Admitting: Certified Nurse Midwife

## 2021-08-16 DIAGNOSIS — Z3042 Encounter for surveillance of injectable contraceptive: Secondary | ICD-10-CM

## 2021-08-16 MED ORDER — MEDROXYPROGESTERONE ACETATE 150 MG/ML IM SUSP: 150.0000 mg | Freq: Once | INTRAMUSCULAR | 0 refills | Status: DC

## 2021-08-16 NOTE — Telephone Encounter (Signed)
Pt called she has a depo injection apt tom- and she is requesting her rx for depo be sent in - stated that she has had trouble in past getting depo and being on time to apt, she was hoping to pick up injection today for apt tomorrow. Please advise. Conf[facility] in [place].  ?

## 2021-08-17 ENCOUNTER — Ambulatory Visit (INDEPENDENT_AMBULATORY_CARE_PROVIDER_SITE_OTHER): Payer: Managed Care, Other (non HMO) | Admitting: Certified Nurse Midwife

## 2021-08-17 DIAGNOSIS — Z3042 Encounter for surveillance of injectable contraceptive: Secondary | ICD-10-CM | POA: Diagnosis not present

## 2021-08-17 NOTE — Patient Instructions (Signed)
Medroxyprogesterone Injection (Contraception) ?What is this medication? ?MEDROXYPROGESTERONE (me DROX ee proe JES te rone) prevents ovulation and pregnancy. It belongs to a group of medications called contraceptives. This medication is a progestin hormone. ?This medicine may be used for other purposes; ask your health care provider or pharmacist if you have questions. ?COMMON BRAND NAME(S): Depo-Provera, Depo-subQ Provera 104 ?What should I tell my care team before I take this medication? ?They need to know if you have any of these conditions: ?Asthma ?Blood clots ?Breast cancer or family history of breast cancer ?Depression ?Diabetes ?Eating disorder (anorexia nervosa) ?Heart attack ?High blood pressure ?HIV infection or AIDS ?If you often drink alcohol ?Kidney disease ?Liver disease ?Migraine headaches ?Osteoporosis, weak bones ?Seizures ?Stroke ?Tobacco smoker ?Vaginal bleeding ?An unusual or allergic reaction to medroxyprogesterone, other hormones, medications, foods, dyes, or preservatives ?Pregnant or trying to get pregnant ?Breast-feeding ?How should I use this medication? ?Depo-Provera CI contraceptive injection is given into a muscle. Depo-subQ Provera 104 injection is given under the skin. It is given in a hospital or clinic setting. The injection is usually given during the first 5 days after the start of a menstrual period or 6 weeks after delivery of a baby. ?A patient package insert for the product will be given with each prescription and refill. Be sure to read this information carefully each time. The sheet may change often. ?Talk to your care team about the use of this medication in children. Special care may be needed. These injections have been used in female children who have started having menstrual periods. ?Overdosage: If you think you have taken too much of this medicine contact a poison control center or emergency room at once. ?NOTE: This medicine is only for you. Do not share this medicine  with others. ?What if I miss a dose? ?Keep appointments for follow-up doses. You must get an injection once every 3 months. It is important not to miss your dose. Call your care team if you are unable to keep an appointment. ?What may interact with this medication? ?Antibiotics or medications for infections, especially rifampin and griseofulvin ?Antivirals for HIV or hepatitis ?Aprepitant ?Armodafinil ?Bexarotene ?Bosentan ?Medications for seizures like carbamazepine, felbamate, oxcarbazepine, phenytoin, phenobarbital, primidone, topiramate ?Mitotane ?Modafinil ?St. John's wort ?This list may not describe all possible interactions. Give your health care provider a list of all the medicines, herbs, non-prescription drugs, or dietary supplements you use. Also tell them if you smoke, drink alcohol, or use illegal drugs. Some items may interact with your medicine. ?What should I watch for while using this medication? ?This medication does not protect you against HIV infection (AIDS) or other sexually transmitted diseases. ?Use of this product may cause you to lose calcium from your bones. Loss of calcium may cause weak bones (osteoporosis). Only use this product for more than 2 years if other forms of birth control are not right for you. The longer you use this product for birth control the more likely you will be at risk for weak bones. Ask your care team how you can keep strong bones. ?You may have a change in bleeding pattern or irregular periods. Many females stop having periods while taking this medication. ?If you have received your injections on time, your chance of being pregnant is very low. If you think you may be pregnant, see your care team as soon as possible. ?Tell your care team if you want to get pregnant within the next year. The effect of this medication may last a   long time after you get your last injection. ?What side effects may I notice from receiving this medication? ?Side effects that you should  report to your care team as soon as possible: ?Allergic reactions--skin rash, itching, hives, swelling of the face, lips, tongue, or throat ?Blood clot--pain, swelling, or warmth in the leg, shortness of breath, chest pain ?Gallbladder problems--severe stomach pain, nausea, vomiting, fever ?Increase in blood pressure ?Liver injury--right upper belly pain, loss of appetite, nausea, light-colored stool, dark yellow or brown urine, yellowing skin or eyes, unusual weakness or fatigue ?New or worsening migraines or headaches ?Seizures ?Stroke--sudden numbness or weakness of the face, arm, or leg, trouble speaking, confusion, trouble walking, loss of balance or coordination, dizziness, severe headache, change in vision ?Unusual vaginal discharge, itching, or odor ?Worsening mood, feelings of depression ?Side effects that usually do not require medical attention (report to your care team if they continue or are bothersome): ?Breast pain or tenderness ?Dark patches of the skin on the face or other sun-exposed areas ?Irregular menstrual cycles or spotting ?Nausea ?Weight gain ?This list may not describe all possible side effects. Call your doctor for medical advice about side effects. You may report side effects to FDA at 1-800-FDA-1088. ?Where should I keep my medication? ?This injection is only given by a care team. It will not be stored at home. ?NOTE: This sheet is a summary. It may not cover all possible information. If you have questions about this medicine, talk to your doctor, pharmacist, or health care provider. ?? 2023 Elsevier/Gold Standard (2020-06-25 00:00:00) ? ?

## 2021-08-17 NOTE — Progress Notes (Signed)
Date last pap: 03/10/2019. ?Last Depo-Provera: 05/25/2021 ?Side Effects if any: none. ?Serum HCG indicated? N/A. ?Depo-Provera 150 mg IM given by: Santiago Bumpers, CMA ?Next appointment due: June 30 - July 14 ?

## 2021-08-21 ENCOUNTER — Other Ambulatory Visit: Payer: Self-pay | Admitting: Certified Nurse Midwife

## 2021-11-09 ENCOUNTER — Ambulatory Visit: Payer: Managed Care, Other (non HMO) | Admitting: Certified Nurse Midwife

## 2021-11-09 DIAGNOSIS — Z3042 Encounter for surveillance of injectable contraceptive: Secondary | ICD-10-CM

## 2021-11-09 NOTE — Progress Notes (Unsigned)
Date last pap: 03/10/2019. Last Depo-Provera: 05/25/2021 Side Effects if any: none. Serum HCG indicated? N/A. Depo-Provera 150 mg IM given by: Santiago Bumpers, CMA Next appointment due: September 22 - October 6

## 2021-11-09 NOTE — Patient Instructions (Signed)
Medroxyprogesterone Injection (Contraception) ?What is this medication? ?MEDROXYPROGESTERONE (me DROX ee proe JES te rone) prevents ovulation and pregnancy. It belongs to a group of medications called contraceptives. This medication is a progestin hormone. ?This medicine may be used for other purposes; ask your health care provider or pharmacist if you have questions. ?COMMON BRAND NAME(S): Depo-Provera, Depo-subQ Provera 104 ?What should I tell my care team before I take this medication? ?They need to know if you have any of these conditions: ?Asthma ?Blood clots ?Breast cancer or family history of breast cancer ?Depression ?Diabetes ?Eating disorder (anorexia nervosa) ?Heart attack ?High blood pressure ?HIV infection or AIDS ?If you often drink alcohol ?Kidney disease ?Liver disease ?Migraine headaches ?Osteoporosis, weak bones ?Seizures ?Stroke ?Tobacco smoker ?Vaginal bleeding ?An unusual or allergic reaction to medroxyprogesterone, other hormones, medications, foods, dyes, or preservatives ?Pregnant or trying to get pregnant ?Breast-feeding ?How should I use this medication? ?Depo-Provera CI contraceptive injection is given into a muscle. Depo-subQ Provera 104 injection is given under the skin. It is given in a hospital or clinic setting. The injection is usually given during the first 5 days after the start of a menstrual period or 6 weeks after delivery of a baby. ?A patient package insert for the product will be given with each prescription and refill. Be sure to read this information carefully each time. The sheet may change often. ?Talk to your care team about the use of this medication in children. Special care may be needed. These injections have been used in female children who have started having menstrual periods. ?Overdosage: If you think you have taken too much of this medicine contact a poison control center or emergency room at once. ?NOTE: This medicine is only for you. Do not share this medicine  with others. ?What if I miss a dose? ?Keep appointments for follow-up doses. You must get an injection once every 3 months. It is important not to miss your dose. Call your care team if you are unable to keep an appointment. ?What may interact with this medication? ?Antibiotics or medications for infections, especially rifampin and griseofulvin ?Antivirals for HIV or hepatitis ?Aprepitant ?Armodafinil ?Bexarotene ?Bosentan ?Medications for seizures like carbamazepine, felbamate, oxcarbazepine, phenytoin, phenobarbital, primidone, topiramate ?Mitotane ?Modafinil ?St. John's wort ?This list may not describe all possible interactions. Give your health care provider a list of all the medicines, herbs, non-prescription drugs, or dietary supplements you use. Also tell them if you smoke, drink alcohol, or use illegal drugs. Some items may interact with your medicine. ?What should I watch for while using this medication? ?This medication does not protect you against HIV infection (AIDS) or other sexually transmitted diseases. ?Use of this product may cause you to lose calcium from your bones. Loss of calcium may cause weak bones (osteoporosis). Only use this product for more than 2 years if other forms of birth control are not right for you. The longer you use this product for birth control the more likely you will be at risk for weak bones. Ask your care team how you can keep strong bones. ?You may have a change in bleeding pattern or irregular periods. Many females stop having periods while taking this medication. ?If you have received your injections on time, your chance of being pregnant is very low. If you think you may be pregnant, see your care team as soon as possible. ?Tell your care team if you want to get pregnant within the next year. The effect of this medication may last a   long time after you get your last injection. ?What side effects may I notice from receiving this medication? ?Side effects that you should  report to your care team as soon as possible: ?Allergic reactions--skin rash, itching, hives, swelling of the face, lips, tongue, or throat ?Blood clot--pain, swelling, or warmth in the leg, shortness of breath, chest pain ?Gallbladder problems--severe stomach pain, nausea, vomiting, fever ?Increase in blood pressure ?Liver injury--right upper belly pain, loss of appetite, nausea, light-colored stool, dark yellow or brown urine, yellowing skin or eyes, unusual weakness or fatigue ?New or worsening migraines or headaches ?Seizures ?Stroke--sudden numbness or weakness of the face, arm, or leg, trouble speaking, confusion, trouble walking, loss of balance or coordination, dizziness, severe headache, change in vision ?Unusual vaginal discharge, itching, or odor ?Worsening mood, feelings of depression ?Side effects that usually do not require medical attention (report to your care team if they continue or are bothersome): ?Breast pain or tenderness ?Dark patches of the skin on the face or other sun-exposed areas ?Irregular menstrual cycles or spotting ?Nausea ?Weight gain ?This list may not describe all possible side effects. Call your doctor for medical advice about side effects. You may report side effects to FDA at 1-800-FDA-1088. ?Where should I keep my medication? ?This injection is only given by a care team. It will not be stored at home. ?NOTE: This sheet is a summary. It may not cover all possible information. If you have questions about this medicine, talk to your doctor, pharmacist, or health care provider. ?? 2023 Elsevier/Gold Standard (2020-06-25 00:00:00) ? ?

## 2022-02-02 ENCOUNTER — Other Ambulatory Visit: Payer: Self-pay | Admitting: Certified Nurse Midwife

## 2022-02-02 DIAGNOSIS — Z3042 Encounter for surveillance of injectable contraceptive: Secondary | ICD-10-CM

## 2022-02-04 ENCOUNTER — Ambulatory Visit (INDEPENDENT_AMBULATORY_CARE_PROVIDER_SITE_OTHER): Payer: Managed Care, Other (non HMO)

## 2022-02-04 ENCOUNTER — Ambulatory Visit: Payer: Managed Care, Other (non HMO)

## 2022-02-04 VITALS — BP 112/82 | HR 91 | Wt 159.0 lb

## 2022-02-04 DIAGNOSIS — Z3042 Encounter for surveillance of injectable contraceptive: Secondary | ICD-10-CM | POA: Diagnosis not present

## 2022-02-04 MED ORDER — MEDROXYPROGESTERONE ACETATE 150 MG/ML IM SUSP
150.0000 mg | Freq: Once | INTRAMUSCULAR | Status: AC
  Administered 2022-02-04: 150 mg via INTRAMUSCULAR

## 2022-02-04 NOTE — Progress Notes (Addendum)
Date last pap: 03/10/2019. Last Depo-Provera: 11/09/2021 Side Effects if any: none. Serum HCG indicated? N/A. Depo-Provera 150 mg IM given by: Otelia Limes, CMA Next appointment due: December 18- January 1

## 2022-02-08 NOTE — Telephone Encounter (Signed)
One refill sent in, patient made aware she needs an annual scheduled for further.

## 2022-04-30 ENCOUNTER — Ambulatory Visit: Payer: Managed Care, Other (non HMO)

## 2022-04-30 DIAGNOSIS — Z3042 Encounter for surveillance of injectable contraceptive: Secondary | ICD-10-CM | POA: Diagnosis not present

## 2022-04-30 MED ORDER — MEDROXYPROGESTERONE ACETATE 150 MG/ML IM SUSP
150.0000 mg | Freq: Once | INTRAMUSCULAR | Status: AC
  Administered 2022-04-30: 150 mg via INTRAMUSCULAR

## 2022-04-30 NOTE — Progress Notes (Signed)
Date last pap: 03/10/2019. Last Depo-Provera: 02/04/2022. Side Effects if any: none. Serum HCG indicated? N/A. Depo-Provera 150 mg IM given [id number]. W, NCMA. Next appointment due 03/13-3/27/2024.

## 2022-04-30 NOTE — Patient Instructions (Signed)

## 2022-05-16 ENCOUNTER — Ambulatory Visit: Payer: Managed Care, Other (non HMO) | Admitting: Certified Nurse Midwife

## 2022-06-14 ENCOUNTER — Other Ambulatory Visit (HOSPITAL_COMMUNITY)
Admission: RE | Admit: 2022-06-14 | Discharge: 2022-06-14 | Disposition: A | Discharge status: Home / Self Care | Payer: Managed Care, Other (non HMO) | Source: Ambulatory Visit | Attending: Certified Nurse Midwife | Admitting: Certified Nurse Midwife

## 2022-06-14 ENCOUNTER — Encounter: Payer: Self-pay | Admitting: Certified Nurse Midwife

## 2022-06-14 ENCOUNTER — Ambulatory Visit (INDEPENDENT_AMBULATORY_CARE_PROVIDER_SITE_OTHER): Payer: Managed Care, Other (non HMO) | Admitting: Certified Nurse Midwife

## 2022-06-14 VITALS — BP 132/82 | HR 99 | Resp 15 | Ht 64.0 in | Wt 167.2 lb

## 2022-06-14 DIAGNOSIS — Z01419 Encounter for gynecological examination (general) (routine) without abnormal findings: Secondary | ICD-10-CM | POA: Insufficient documentation

## 2022-06-14 DIAGNOSIS — Z124 Encounter for screening for malignant neoplasm of cervix: Secondary | ICD-10-CM

## 2022-06-14 DIAGNOSIS — Z3042 Encounter for surveillance of injectable contraceptive: Secondary | ICD-10-CM

## 2022-06-14 MED ORDER — MEDROXYPROGESTERONE ACETATE 150 MG/ML IM SUSP: 150.0000 mg | Freq: Once | INTRAMUSCULAR | 3 refills | Status: DC

## 2022-06-14 NOTE — Addendum Note (Signed)
Addended by: Hildred Priest on: 06/14/2022 02:16 PM   Modules accepted: Orders

## 2022-06-14 NOTE — Patient Instructions (Signed)

## 2022-06-14 NOTE — Progress Notes (Signed)
GYNECOLOGY ANNUAL PREVENTATIVE CARE ENCOUNTER NOTE  History:     Anita Hurst is a 27 y.o. [phone] female here for a routine annual gynecologic exam.  Current complaints: weight gain .   Denies abnormal vaginal bleeding, discharge, pelvic pain, problems with intercourse or other gynecologic concerns.     Social Relationship: married  Living: spouse and child Work: FT Teacher, English as a foreign language  Exercise: 3 x wk  Smoke/Alcohol/drug use: occasional alcohol use   Gynecologic History No LMP recorded (within months). Patient has had an injection. Contraception: Depo-Provera injections Last Pap: 03/10/2019. Results were: normal  Last mammogram: n/a . Family hx, is interested in having in 73s   Obstetric History OB History  Gravida Para Term Preterm AB Living  2 2 1 1   2  $ SAB IAB Ectopic Multiple Live Births          2    # Outcome Date GA Lbr Len/2nd Weight Sex Delivery Anes PTL Lv  2 Term 10/18/20 89w4d13:00 / 01:35 8 lb 3.9 oz (3.74 kg) F VBAC EPI  LIV  1 Preterm 11/05/15 327w3d M CS-LTranv  Y LIV    Past Medical History:  Diagnosis Date   GERD (gastroesophageal reflux disease)    Pyelonephritis affecting pregnancy in first trimester, antepartum     Past Surgical History:  Procedure Laterality Date   CESAREAN SECTION     NO PAST SURGERIES     WISDOM TOOTH EXTRACTION  2019    Current Outpatient Medications on File Prior to Visit  Medication Sig Dispense Refill   albuterol (VENTOLIN HFA) 108 (90 Base) MCG/ACT inhaler Inhale into the lungs.     medroxyPROGESTERone (DEPO-PROVERA) 150 MG/ML injection INJECT 1 ML (150 MG TOTAL) INTO THE MUSCLE ONCE FOR 1 DOSE. 1 mL 0   Current Facility-Administered Medications on File Prior to Visit  Medication Dose Route Frequency Provider Last Rate Last Admin   medroxyPROGESTERone (DEPO-PROVERA) injection 150 mg  150 mg Intramuscular Q90 days ThPhilip AspenCNM   150 mg at 11/09/21 1344    Allergies  Allergen  Reactions   Bee Venom Hives and Rash   Sulfa Antibiotics Hives and Rash    Social History:  reports that she has never smoked. She has never used smokeless tobacco. She reports that she does not currently use alcohol. She reports that she does not use drugs.  Family History  Problem Relation Age of Onset   Cancer Mother        Breast   Diabetes Mother    Breast cancer Mother    Diabetes Maternal Grandmother    Heart failure Maternal Grandmother    Ovarian cancer Maternal Grandmother    Diabetes Maternal Grandfather    Diabetes Father    Heart attack Father    Cancer Father    Breast cancer Maternal Aunt     The following portions of the patient's history were reviewed and updated as appropriate: allergies, current medications, past family history, past medical history, past social history, past surgical history and problem list.  Review of Systems Pertinent items noted in HPI and remainder of comprehensive ROS otherwise negative.  Physical Exam:  BP 132/82   Pulse 99   Resp 15   Ht 5' 4"$  (1.626 m)   Wt 167 lb 3.2 oz (75.8 kg)   LMP  (Within Months)   Breastfeeding No   BMI 28.70 kg/m  CONSTITUTIONAL: Well-developed, well-nourished female in no acute distress.  HENT:  Normocephalic, atraumatic, External right and left ear normal. Oropharynx is clear and moist EYES: Conjunctivae and EOM are normal. Pupils are equal, round, and reactive to light. No scleral icterus.  NECK: Normal range of motion, supple, no masses.  Normal thyroid.  SKIN: Skin is warm and dry. No rash noted. Not diaphoretic. No erythema. No pallor. MUSCULOSKELETAL: Normal range of motion. No tenderness.  No cyanosis, clubbing, or edema.  2+ distal pulses. NEUROLOGIC: Alert and oriented to person, place, and time. Normal reflexes, muscle tone coordination.  PSYCHIATRIC: Normal mood and affect. Normal behavior. Normal judgment and thought content. CARDIOVASCULAR: Normal heart rate noted, regular  rhythm RESPIRATORY: Clear to auscultation bilaterally. Effort and breath sounds normal, no problems with respiration noted. BREASTS: Symmetric in size. No masses, tenderness, skin changes, nipple drainage, or lymphadenopathy bilaterally.  ABDOMEN: Soft, no distention noted.  No tenderness, rebound or guarding.  PELVIC: Normal appearing external genitalia and urethral meatus; normal appearing vaginal mucosa and cervix.  No abnormal discharge noted.  Pap smear obtained.  Normal uterine size, no other palpable masses, no uterine or adnexal tenderness.  .   Assessment and Plan:    1. Women's annual routine gynecological examination   Pap: Will follow up results of pap smear and manage accordingly. Mammogram : n/a  Labs: declines, has done through her work  Refills: depo  Referral: none Discussed depo in relationship to weight gain. Discussed other options . Pt declines at this time.  Routine preventative health maintenance measures emphasized. Please refer to After Visit Summary for other counseling recommendations.      Philip Aspen, [facility]  [facility]

## 2022-06-20 LAB — CYTOLOGY - PAP: Diagnosis: NEGATIVE

## 2022-07-23 ENCOUNTER — Ambulatory Visit (INDEPENDENT_AMBULATORY_CARE_PROVIDER_SITE_OTHER): Payer: Managed Care, Other (non HMO)

## 2022-07-23 VITALS — BP 119/82 | HR 94 | Ht 64.0 in | Wt 165.2 lb

## 2022-07-23 DIAGNOSIS — Z3042 Encounter for surveillance of injectable contraceptive: Secondary | ICD-10-CM | POA: Insufficient documentation

## 2022-07-23 NOTE — Patient Instructions (Signed)

## 2022-07-23 NOTE — Progress Notes (Signed)
    NURSE VISIT NOTE  Subjective:    Patient ID: Anita Hurst, female    DOB: [birth year], 27 y.o.   MRN: [id number]  HPI  Patient is a 27 y.o. G29P1102 female who presents for depo provera injection.   Objective:    BP 119/82   Pulse 94   Ht 5\' 4"  (1.626 m)   Wt 165 lb 3.2 oz (74.9 kg)   BMI 28.36 kg/m   Last Annual: 06/14/2022. Last pap: 06/14/2022. Last Depo-Provera: 04/30/2022. Side Effects if any: none. Serum HCG indicated? No . Depo-Provera 150 mg IM given by: Otila Kluver, LPN. Site: Left Deltoid  Lab Review    Assessment:   1. Encounter for surveillance of injectable contraceptive      Plan:   Next appointment due between 10/08/2022 and 10/22/2022.    Otila Kluver, LPN

## 2022-10-15 ENCOUNTER — Ambulatory Visit: Payer: Managed Care, Other (non HMO)

## 2022-10-15 VITALS — BP 116/83 | HR 90 | Wt 168.7 lb

## 2022-10-15 DIAGNOSIS — Z3042 Encounter for surveillance of injectable contraceptive: Secondary | ICD-10-CM

## 2022-10-15 MED ORDER — MEDROXYPROGESTERONE ACETATE 150 MG/ML IM SUSP
150.0000 mg | Freq: Once | INTRAMUSCULAR | Status: AC
  Administered 2022-10-15: 150 mg via INTRAMUSCULAR

## 2022-10-15 NOTE — Progress Notes (Addendum)
    NURSE VISIT NOTE  Subjective:    Patient ID: Anita Hurst, female    DOB: 1996/01/22, 27 y.o.   MRN: [id number]  HPI  Patient is a 27 y.o. G5P1102 female who presents for depo provera injection.   Objective:    BP 116/83   Pulse 90   Wt 168 lb 11.2 oz (76.5 kg)   BMI 28.96 kg/m   Last Annual: 06/14/2022. Last pap: 06/14/2022. Last Depo-Provera: 07/23/2022. Side Effects if any: none. Serum HCG indicated? No . Depo-Provera 150 mg IM given by: Sheliah Hatch, CMA. Site: Right Deltoid  Lab Review    Assessment:   1. Encounter for surveillance of injectable contraceptive      Plan:   Next appointment due between 8/27 and 01/14/23.    Fonda Kinder, CMA

## 2022-10-15 NOTE — Patient Instructions (Signed)

## 2023-01-07 ENCOUNTER — Ambulatory Visit (INDEPENDENT_AMBULATORY_CARE_PROVIDER_SITE_OTHER): Payer: Managed Care, Other (non HMO)

## 2023-01-07 VITALS — BP 122/78 | HR 93 | Ht 66.0 in | Wt 171.0 lb

## 2023-01-07 DIAGNOSIS — Z3042 Encounter for surveillance of injectable contraceptive: Secondary | ICD-10-CM

## 2023-01-07 MED ORDER — MEDROXYPROGESTERONE ACETATE 150 MG/ML IM SUSP
150.0000 mg | Freq: Once | INTRAMUSCULAR | Status: AC
  Administered 2023-01-07: 150 mg via INTRAMUSCULAR

## 2023-01-07 NOTE — Progress Notes (Signed)
    NURSE VISIT NOTE  Subjective:    Patient ID: Anita Hurst, female    DOB: Jun 15, 1995, 27 y.o.   MRN: [id number]  HPI  Patient is a 27 y.o. G36P1102 female who presents for depo provera injection.   Objective:    BP 122/78   Pulse 93   Ht 5\' 6"  (1.676 m)   Wt 171 lb (77.6 kg)   LMP 12/04/2022   BMI 27.60 kg/m   Last Annual: 06/14/22. Last pap: 06/14/22. Last Depo-Provera: 10/15/22. Side Effects if any: none. Serum HCG indicated? No . Depo-Provera 150 mg IM given by: Cornelius Moras, CMA. Site: Left Deltoid    Assessment:   1. Encounter for surveillance of injectable contraceptive      Plan:   Next appointment due between 03/25/23 and 04/08/23.    Cornelius Moras, CMA

## 2023-01-07 NOTE — Patient Instructions (Signed)
Contraceptive Injection A contraceptive injection is a shot that prevents pregnancy. It is also called a birth control shot. The shot contains the hormone progestin, which prevents pregnancy by: Stopping the ovaries from releasing eggs. Thickening cervical mucus to prevent sperm from entering the cervix. Thinning the lining of the uterus to prevent a fertilized egg from attaching to the uterus. Contraceptive injections are given under the skin (subcutaneous) or into a muscle (intramuscular). For these shots to work, you must get one of them every 3 months (12-13 weeks) from a health care provider. Tell a health care provider about: Any allergies you have. All medicines you are taking, including vitamins, herbs, eye drops, creams, and over-the-counter medicines. Any blood disorders you have. Any medical conditions you have. Whether you are pregnant or may be pregnant. What are the risks? Generally, this is a safe procedure. However, problems may occur, including: Mood changes or depression. Loss of bone density (osteoporosis) after long-term use. Blood clots. This is rare. Higher risk of an egg being fertilized outside your uterus (ectopic pregnancy).This is rare. What happens before the procedure? Your health care provider may do a routine physical exam. You may have a test to make sure you are not pregnant. What happens during the procedure?  The area where the shot will be given will be cleaned and sanitized with alcohol. A needle will be inserted into a muscle in your upper arm or buttock, or into the skin of your thigh or abdomen. The needle will be attached to a syringe with the medicine inside of it. The medicine will be pushed through the syringe and injected into your body. A small bandage (dressing) may be placed over the injection site. What can I expect after the procedure? After the procedure, it is common to have: Soreness around the injection site for a couple of  days. Irregular menstrual bleeding. Weight gain. Breast tenderness. Headaches. Discomfort in your abdomen. Ask your health care provider whether you need to use an added method of birth control (backup contraception), such as a condom, sponge, or spermicide. If the first shot is given 1-7 days after the start of your last menstrual period, you will not need backup contraception. If the first shot is given at any other time during your menstrual cycle, you should avoid having sex. If you do have sex, you will need to use backup contraception for 7 days after you receive the shot. Follow these instructions at home: General instructions Take over-the-counter and prescription medicines only as told by your health care provider. Do not rub or massage the injection site. Track your menstrual periods so you will know if they become irregular. Always use a condom to protect against sexually transmitted infections (STIs). Make sure you schedule an appointment in time for your next shot and mark it on your calendar. You must get an injection every 3 months (12-13 weeks) to prevent pregnancy. Lifestyle Do not use any products that contain nicotine or tobacco. These products include cigarettes, chewing tobacco, and vaping devices, such as e-cigarettes. If you need help quitting, ask your health care provider. Eat foods that are high in calcium and vitamin D, such as milk, cheese, and salmon. Doing this may help with any loss in bone density caused by the contraceptive injection. Ask your health care provider for dietary recommendations. Contact a health care provider if you: Have nausea or vomiting. Have abnormal vaginal discharge or bleeding. Miss a menstrual period or think you might be pregnant. Experience mood changes   or depression. Feel dizzy or light-headed. Have leg pain. Get help right away if you: Have chest pain or cough up blood. Have shortness of breath. Have a severe headache that does  not go away. Have numbness in any part of your body. Have slurred speech or vision problems. Have vaginal bleeding that is abnormally heavy or does not stop, or you have severe pain in your abdomen. Have depression that does not get better with treatment. If you ever feel like you may hurt yourself or others, or have thoughts about taking your own life, get help right away. Go to your nearest emergency department or: Call your local emergency services (911 in the [place] Call a suicide crisis helpline, such as the National Suicide Prevention Lifeline at 1-800-273-8255 or 988 in the [place] This is open 24 hours a day in the [place] Text the Crisis Text Line at 741741 (in the [place] Summary A contraceptive injection is a shot that prevents pregnancy. It is also called the birth control shot. The shot is given under the skin (subcutaneous) or into a muscle (intramuscular). After this procedure, it is common to have soreness around the injection site for a couple of days. To prevent pregnancy, the shot must be given by a health care provider every 3 months (12-13 weeks). After you have the shot, ask your health care provider whether you need to use an added method of birth control (backup contraception), such as a condom, sponge, or spermicide. This information is not intended to replace advice given to you by your health care provider. Make sure you discuss any questions you have with your health care provider. Document Revised: 11/15/2020 Document Reviewed: 11/01/2019 Elsevier Patient Education  2024 [employer]vier Inc[employer]  

## 2023-04-01 ENCOUNTER — Ambulatory Visit: Payer: Managed Care, Other (non HMO)

## 2023-04-01 VITALS — BP 103/79 | HR 90 | Wt 159.9 lb

## 2023-04-01 DIAGNOSIS — Z3042 Encounter for surveillance of injectable contraceptive: Secondary | ICD-10-CM

## 2023-04-01 MED ORDER — MEDROXYPROGESTERONE ACETATE 150 MG/ML IM SUSP
150.0000 mg | Freq: Once | INTRAMUSCULAR | Status: AC
  Administered 2023-04-01: 150 mg via INTRAMUSCULAR

## 2023-04-01 NOTE — Progress Notes (Signed)
    NURSE VISIT NOTE  Subjective:    Patient ID: Anita Hurst, female    DOB: 1996/02/04, 27 y.o.   MRN: [id number]  HPI  Patient is a 27 y.o. G32P1102 female who presents for depo provera injection.   Objective:    BP 103/79   Pulse 90   Wt 159 lb 14.4 oz (72.5 kg)   BMI 25.81 kg/m   Last Annual: 06/14/2022. Last pap: 06/14/2022. Last Depo-Provera: 01/07/2023. Side Effects if any: none. Serum HCG indicated? No . Depo-Provera 150 mg IM given by: Sheliah Hatch, CMA. Site: Right Deltoid  Lab Review    Assessment:   1. Encounter for surveillance of injectable contraceptive      Plan:   Next appointment due between 06/17/23 and 07/01/23.    Fonda Kinder, CMA

## 2023-04-01 NOTE — Patient Instructions (Signed)

## 2023-06-25 ENCOUNTER — Ambulatory Visit: Payer: Managed Care, Other (non HMO)

## 2023-07-01 ENCOUNTER — Ambulatory Visit (INDEPENDENT_AMBULATORY_CARE_PROVIDER_SITE_OTHER): Payer: Managed Care, Other (non HMO)

## 2023-07-01 VITALS — BP 124/83 | HR 94 | Ht 66.0 in | Wt 164.7 lb

## 2023-07-01 DIAGNOSIS — Z3042 Encounter for surveillance of injectable contraceptive: Secondary | ICD-10-CM | POA: Diagnosis not present

## 2023-07-01 MED ORDER — MEDROXYPROGESTERONE ACETATE 150 MG/ML IM SUSP
150.0000 mg | Freq: Once | INTRAMUSCULAR | Status: AC
  Administered 2023-07-01: 150 mg via INTRAMUSCULAR

## 2023-07-01 NOTE — Patient Instructions (Signed)

## 2023-07-01 NOTE — Progress Notes (Signed)
    NURSE VISIT NOTE  Subjective:    Patient ID: Anita Hurst, female    DOB: [birth year], 28 y.o.   MRN: [id number]  HPI  Patient is a 28 y.o. G72P1102 female who presents for depo provera injection.   Objective:    BP 124/83   Pulse 94   Ht 5\' 6"  (1.676 m)   Wt 164 lb 11.2 oz (74.7 kg)   BMI 26.58 kg/m   Last Annual: 06/14/2022. Last pap: 06/14/2022. Last Depo-Provera: 03/2023. Side Effects if any: none. Serum HCG indicated? No . Depo-Provera 150 mg IM given by: Rocco Serene, LPN. Site: Left Upper Outer Quandrant  Lab Review   Assessment:   1. Encounter for surveillance of injectable contraceptive      Plan:   Return to Clinic as scheduled 08/2023 for annual. Next Depo due between 09/16/23 and 09/30/23.    Rocco Serene, LPN

## 2023-08-14 ENCOUNTER — Ambulatory Visit: Payer: Managed Care, Other (non HMO) | Admitting: Certified Nurse Midwife

## 2023-08-25 ENCOUNTER — Encounter: Payer: Self-pay | Admitting: Certified Nurse Midwife

## 2023-08-25 ENCOUNTER — Ambulatory Visit (INDEPENDENT_AMBULATORY_CARE_PROVIDER_SITE_OTHER): Admitting: Certified Nurse Midwife

## 2023-08-25 VITALS — BP 114/7 | HR 91 | Ht 65.0 in | Wt 164.5 lb

## 2023-08-25 DIAGNOSIS — Z3042 Encounter for surveillance of injectable contraceptive: Secondary | ICD-10-CM

## 2023-08-25 DIAGNOSIS — Z01419 Encounter for gynecological examination (general) (routine) without abnormal findings: Secondary | ICD-10-CM | POA: Diagnosis not present

## 2023-08-25 MED ORDER — MEDROXYPROGESTERONE ACETATE 150 MG/ML IM SUSP: 150.0000 mg | Freq: Once | INTRAMUSCULAR | 3 refills | Status: DC

## 2023-08-25 NOTE — Progress Notes (Signed)
 GYNECOLOGY ANNUAL PREVENTATIVE CARE ENCOUNTER NOTE  History:     Anita Hurst is a 28 y.o. [phone] female here for a routine annual gynecologic exam.  Current complaints: none.   Denies abnormal vaginal bleeding, discharge, pelvic pain, problems with intercourse or other gynecologic concerns.     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 Patient has had an injection. Contraception: Depo-Provera  injections Last Pap: 06/14/2022. Results were: normal Last mammogram: n/a.   Upstream - 08/25/23 1534       Pregnancy Intention Screening   Does the patient want to become pregnant in the next year? No    Does the patient's partner want to become pregnant in the next year? No    Would the patient like to discuss contraceptive options today? No      Contraception Wrap Up   Current Method Hormonal Injection    End Method Hormonal Injection    Contraception Counseling Provided No            The pregnancy intention screening data noted above was reviewed. Potential methods of contraception were discussed. The patient elected to proceed with Hormonal Injection.   Obstetric History OB History  Gravida Para Term Preterm AB Living  2 2 1 1  2   SAB IAB Ectopic Multiple Live Births      2    # Outcome Date GA Lbr Len/2nd Weight Sex Type Anes PTL Lv  2 Term 10/18/20 [redacted]w[redacted]d 13:00 / 01:35 8 lb 3.9 oz (3.74 kg) F VBAC EPI  LIV  1 Preterm 11/05/15 [redacted]w[redacted]d   M CS-LTranv  Y LIV    Past Medical History:  Diagnosis Date   GERD (gastroesophageal reflux disease)    Pyelonephritis affecting pregnancy in first trimester, antepartum     Past Surgical History:  Procedure Laterality Date   CESAREAN SECTION     NO PAST SURGERIES     WISDOM TOOTH EXTRACTION  2019    Current Outpatient Medications on File Prior to  Visit  Medication Sig Dispense Refill   albuterol (VENTOLIN HFA) 108 (90 Base) MCG/ACT inhaler Inhale into the lungs.     meclizine (ANTIVERT) 25 MG tablet SMARTSIG:1 Tablet(s) By Mouth Every 12 Hours PRN     medroxyPROGESTERone  (DEPO-PROVERA ) 150 MG/ML injection Inject 1 mL (150 mg total) into the muscle once for 1 dose. 1 mL 3   No current facility-administered medications on file prior to visit.    Allergies  Allergen Reactions   Bee Venom Hives and Rash   Sulfa Antibiotics Hives and Rash    Social History:  reports that she has never smoked. She has never used smokeless tobacco. She reports that she does not currently use alcohol. She reports that she does not use drugs.  Family History  Problem Relation Age of Onset   Cancer Mother        Breast   Diabetes Mother    Breast cancer Mother    Diabetes Maternal Grandmother    Heart failure Maternal Grandmother    Ovarian cancer Maternal Grandmother    Diabetes Maternal Grandfather    Diabetes Father    Heart attack Father    Cancer Father    Breast cancer Maternal Aunt     The following portions of the patient's history were reviewed and updated as appropriate: allergies, current medications,  past family history, past medical history, past social history, past surgical history and problem list.  Review of Systems Pertinent items noted in HPI and remainder of comprehensive ROS otherwise negative.  Physical Exam:  BP (!) 114/7   Pulse 91   Ht 5\' 5"  (1.651 m)   Wt 164 lb 8 oz (74.6 kg)   BMI 27.37 kg/m  CONSTITUTIONAL: Well-developed, well-nourished female in no acute distress.  HENT:  Normocephalic, atraumatic, External right and left ear normal. Oropharynx is clear and moist EYES: Conjunctivae and EOM are normal. Pupils are equal, round, and reactive to light. No scleral icterus.  NECK: Normal range of motion, supple, no masses.  Normal thyroid.  SKIN: Skin is warm and dry. No rash noted. Not diaphoretic. No erythema.  No pallor. MUSCULOSKELETAL: Normal range of motion. No tenderness.  No cyanosis, clubbing, or edema.  2+ distal pulses. NEUROLOGIC: Alert and oriented to person, place, and time. Normal reflexes, muscle tone coordination.  PSYCHIATRIC: Normal mood and affect. Normal behavior. Normal judgment and thought content. CARDIOVASCULAR: Normal heart rate noted, regular rhythm RESPIRATORY: Clear to auscultation bilaterally. Effort and breath sounds normal, no problems with respiration noted. BREASTS: Symmetric in size. No masses, tenderness, skin changes, nipple drainage, or lymphadenopathy bilaterally.  ABDOMEN: Soft, no distention noted.  No tenderness, rebound or guarding.  PELVIC: Normal appearing external genitalia and urethral meatus; normal appearing vaginal mucosa and cervix.  No abnormal discharge noted.  Pap smear not due.  Normal uterine size, no other palpable masses, no uterine or adnexal tenderness.  .   Assessment and Plan:    1. Women's annual routine gynecological examination (Primary)  Pap: not due  Mammogram : n/a  Labs: none  Refills: depo provera   Referral: none Routine preventative health maintenance measures emphasized. Please refer to After Visit Summary for other counseling recommendations.      Alise Appl, CNM [facility]  [facility] [place],  [facility]

## 2023-08-25 NOTE — Patient Instructions (Signed)

## 2023-08-25 NOTE — Progress Notes (Deleted)
 GYNECOLOGY ANNUAL PREVENTATIVE CARE ENCOUNTER NOTE  History:     Anita Hurst is a 28 y.o. [phone] female here for a routine annual gynecologic exam.  Current complaints: ***.   Denies abnormal vaginal bleeding, discharge, pelvic pain, problems with intercourse or other gynecologic concerns.     Social Relationship: Living: Work: Exercise: Smoke/Alcohol/drug use:  Gynecologic History No LMP recorded. Patient has had an injection. Contraception: {method:5051} Last Pap: ***. Results were: {norm/abn:16337} with negative HPV Last mammogram: ***. Results were: {norm/abn:16337}  Obstetric History OB History  Gravida Para Term Preterm AB Living  2 2 1 1  2   SAB IAB Ectopic Multiple Live Births      2    # Outcome Date GA Lbr Len/2nd Weight Sex Type Anes PTL Lv  2 Term 10/18/20 [redacted]w[redacted]d 13:00 / 01:35 8 lb 3.9 oz (3.74 kg) F VBAC EPI  LIV  1 Preterm 11/05/15 [redacted]w[redacted]d   M CS-LTranv  Y LIV    Past Medical History:  Diagnosis Date   GERD (gastroesophageal reflux disease)    Pyelonephritis affecting pregnancy in first trimester, antepartum     Past Surgical History:  Procedure Laterality Date   CESAREAN SECTION     NO PAST SURGERIES     WISDOM TOOTH EXTRACTION  2019    Current Outpatient Medications on File Prior to Visit  Medication Sig Dispense Refill   albuterol (VENTOLIN HFA) 108 (90 Base) MCG/ACT inhaler Inhale into the lungs.     medroxyPROGESTERone  (DEPO-PROVERA ) 150 MG/ML injection Inject 1 mL (150 mg total) into the muscle once for 1 dose. 1 mL 3   No current facility-administered medications on file prior to visit.    Allergies  Allergen Reactions   Bee Venom Hives and Rash   Sulfa Antibiotics Hives and Rash    Social History:  reports that she has never smoked. She has never used smokeless tobacco. She reports that she does not currently use alcohol. She reports that she does not use drugs.  Family History  Problem Relation Age of Onset    Cancer Mother        Breast   Diabetes Mother    Breast cancer Mother    Diabetes Maternal Grandmother    Heart failure Maternal Grandmother    Ovarian cancer Maternal Grandmother    Diabetes Maternal Grandfather    Diabetes Father    Heart attack Father    Cancer Father    Breast cancer Maternal Aunt     The following portions of the patient's history were reviewed and updated as appropriate: allergies, current medications, past family history, past medical history, past social history, past surgical history and problem list.  Review of Systems Pertinent items noted in HPI and remainder of comprehensive ROS otherwise negative.  Physical Exam:  There were no vitals taken for this visit. CONSTITUTIONAL: Well-developed, well-nourished female in no acute distress.  HENT:  Normocephalic, atraumatic, External right and left ear normal. Oropharynx is clear and moist EYES: Conjunctivae and EOM are normal. Pupils are equal, round, and reactive to light. No scleral icterus.  NECK: Normal range of motion, supple, no masses.  Normal thyroid.  SKIN: Skin is warm and dry. No rash noted. Not diaphoretic. No erythema. No pallor. MUSCULOSKELETAL: Normal range of motion. No tenderness.  No cyanosis, clubbing, or edema.  2+ distal pulses. NEUROLOGIC: Alert and oriented to person, place, and time. Normal reflexes, muscle tone coordination.  PSYCHIATRIC: Normal mood and affect. Normal  behavior. Normal judgment and thought content. CARDIOVASCULAR: Normal heart rate noted, regular rhythm RESPIRATORY: Clear to auscultation bilaterally. Effort and breath sounds normal, no problems with respiration noted. BREASTS: Symmetric in size. No masses, tenderness, skin changes, nipple drainage, or lymphadenopathy bilaterally.  ABDOMEN: Soft, no distention noted.  No tenderness, rebound or guarding.  PELVIC: Normal appearing external genitalia and urethral meatus; normal appearing vaginal mucosa and cervix.  No  abnormal discharge noted.  Pap smear obtained.  Normal uterine size, no other palpable masses, no uterine or adnexal tenderness.  .   Assessment and Plan:    There are no diagnoses linked to this encounter. Will follow up results of pap smear and manage accordingly. Pap: Mammogram : Labs: Refills: Referral: Routine preventative health maintenance measures emphasized. Please refer to After Visit Summary for other counseling recommendations.      Alise Appl, CNM [facility]  [facility],  [facility]

## 2023-09-23 NOTE — Progress Notes (Signed)
    NURSE VISIT NOTE  Subjective:    Patient ID: Anita Hurst, female    DOB: 1996-01-04, 28 y.o.   MRN: [id number]  HPI  Patient is a 28 y.o. G78P1102 female who presents for depo provera  injection.   Objective:    BP 122/85   Pulse 86   Ht 5\' 6"  (1.676 m)   Wt 165 lb (74.8 kg)   LMP  (Within Months)   BMI 26.63 kg/m   Last Annual: 08/25/23. Last pap: 06/14/22. Last Depo-Provera : 07/01/23. Side Effects if any: none. Serum HCG indicated? No . Depo-Provera  150 mg IM given by: Venetta Gill, CMA. Site: Right Upper Outer Quandrant  Lab Review    Assessment:   1. Encounter for management and injection of depo-Provera       Plan:   Next appointment due between 12/10/2023 and 12/24/2023.    Inga Manges, CMA

## 2023-09-24 ENCOUNTER — Ambulatory Visit (INDEPENDENT_AMBULATORY_CARE_PROVIDER_SITE_OTHER): Payer: Managed Care, Other (non HMO)

## 2023-09-24 VITALS — BP 122/85 | HR 86 | Ht 66.0 in | Wt 165.0 lb

## 2023-09-24 DIAGNOSIS — Z3042 Encounter for surveillance of injectable contraceptive: Secondary | ICD-10-CM | POA: Diagnosis not present

## 2023-09-24 MED ORDER — MEDROXYPROGESTERONE ACETATE 150 MG/ML IM SUSP
150.0000 mg | Freq: Once | INTRAMUSCULAR | Status: AC
  Administered 2023-09-24: 150 mg via INTRAMUSCULAR

## 2023-09-24 NOTE — Patient Instructions (Signed)

## 2023-12-17 ENCOUNTER — Ambulatory Visit

## 2023-12-17 VITALS — BP 126/99 | HR 86 | Ht 66.0 in | Wt 168.3 lb

## 2023-12-17 DIAGNOSIS — Z3042 Encounter for surveillance of injectable contraceptive: Secondary | ICD-10-CM | POA: Diagnosis not present

## 2023-12-17 MED ORDER — MEDROXYPROGESTERONE ACETATE 150 MG/ML IM SUSP
150.0000 mg | Freq: Once | INTRAMUSCULAR | Status: AC
  Administered 2023-12-17 (×2): 150 mg via INTRAMUSCULAR

## 2023-12-17 NOTE — Patient Instructions (Signed)

## 2023-12-17 NOTE — Progress Notes (Signed)
    NURSE VISIT NOTE  Subjective:    Patient ID: Anita Hurst, female    DOB: 1995/09/24, 28 y.o.   MRN: [id number]  HPI  Patient is a 28 y.o. G45P1102 female who presents for depo provera  injection. BP is elevated today. Patient states she had coffee prior to her visit and was rushing.   Objective:    BP (!) 126/99   Pulse 86   Ht 5' 6 (1.676 m)   Wt 168 lb 4.8 oz (76.3 kg)   BMI 27.16 kg/m   Last Annual: 08/25/23. Last pap: 08/13/22. Last Depo-Provera : 09/24/23. Side Effects if any: none. Serum HCG indicated? No . Depo-Provera  150 mg IM given by: Camelia Bars, LPN. Site: Left Upper Outer Quandrant   Assessment:   1. Encounter for surveillance of injectable contraceptive      Plan:   Next appointment due between 10/29 and 03/17/24. Patient will check her blood pressure over the next week and establish care with PCP if still elevated.    Camelia Bars, LPN

## 2024-03-15 ENCOUNTER — Ambulatory Visit

## 2024-03-15 NOTE — Progress Notes (Deleted)
    NURSE VISIT NOTE  Subjective:    Patient ID: Margean Korell, female    DOB: October 08, 1995, 28 y.o.   MRN: [id number]  HPI  Patient is a 28 y.o. G67P1102 female who presents for depo provera  injection.   Objective:    There were no vitals taken for this visit.  Last Annual: 08/25/23. Last pap: 08/13/23. Last Depo-Provera : 12/17/23. Side Effects if any: none. Serum HCG indicated? No . Depo-Provera  150 mg IM given by: Harlene Gander, CMA. Site: {AOB INJ [id number]  Lab Review  No results found for any visits on 03/15/24.  Assessment:   1. Encounter for surveillance of injectable contraceptive      Plan:   Next appointment due between 05/31/24 and 06/14/24.    Harlene Gander, CMA

## 2024-03-16 ENCOUNTER — Ambulatory Visit: Admitting: Obstetrics and Gynecology

## 2024-03-16 ENCOUNTER — Encounter: Payer: Self-pay | Admitting: Obstetrics and Gynecology

## 2024-03-16 VITALS — BP 144/81 | HR 114 | Ht 66.0 in | Wt 168.0 lb

## 2024-03-16 DIAGNOSIS — Z803 Family history of malignant neoplasm of breast: Secondary | ICD-10-CM | POA: Diagnosis not present

## 2024-03-16 DIAGNOSIS — Z30011 Encounter for initial prescription of contraceptive pills: Secondary | ICD-10-CM

## 2024-03-16 MED ORDER — MICROGESTIN 24 FE 1-20 MG-MCG PO TABS: 1.0000 | ORAL_TABLET | Freq: Every day | ORAL | 2 refills | Status: AC

## 2024-03-16 NOTE — Progress Notes (Signed)
 Pcp, No   Chief Complaint  Patient presents with   Follow-up    Rash on arms and leg that happens before or after depo injection, has happened with the last 3 depo injections.    HPI:      Ms. Anita Hurst is a 28 y.o. [id number] whose LMP was No LMP recorded. Patient has had an injection., presents today for [facility] consult. Has been getting rash at injection site past 3 times as well as random patches of eczema recently. No hx of eczema in past. Has random, light bleeding for a few days on depo, no dysmen. Depo due yesterday but didn't do due to rash, wants to discuss other [facility] options. No hx of HTN, DVTs, migraines with aura.  Did hormonal IUD age 12 with ectopic pregnancy; OCPs in college but didn't take daily and conceived; Paragard IUD with recurrent BV, resolved after removal. Would be willing to do OCPs again. FH breast cancer with BRCA gene in her mom (pt unsure if 1 or 2). Started process for cancer genetic testing before covid then shut down due to covid. Would be interested in testing. Also may want TL for BC.  Last annual 4/25.   Patient Active Problem List   Diagnosis Date Noted   Encounter for surveillance of injectable contraceptive 07/23/2022   History of preterm delivery 02/15/2020   History of HELLP syndrome, currently pregnant 02/15/2020   Family history of breast cancer 03/10/2019   History of pre-eclampsia 03/10/2019   Frequent UTI 03/10/2019    Past Surgical History:  Procedure Laterality Date   CESAREAN SECTION     WISDOM TOOTH EXTRACTION  2019    Family History  Problem Relation Age of Onset   Diabetes Mother    Breast cancer Mother        twice, early 33s and late 32, 24, BRCA POSITIVEs   Diabetes Father    Heart attack Father    Colon cancer Father 53   Diabetes Maternal Grandmother    Heart failure Maternal Grandmother    Ovarian cancer Maternal Grandmother        8s   Diabetes Maternal Grandfather    Breast cancer Maternal Aunt         early 80s    Social History   Socioeconomic History   Marital status: Married    Spouse name: Not on file   Number of children: Not on file   Years of education: Not on file   Highest education level: Not on file  Occupational History   Not on file  Tobacco Use   Smoking status: Never   Smokeless tobacco: Never  Vaping Use   Vaping status: Never Used  Substance and Sexual Activity   Alcohol use: Yes    Comment: occ   Drug use: No   Sexual activity: Yes    Birth control/protection: Injection  Other Topics Concern   Not on file  Social History Narrative   Not on file   Social Drivers of Health   Financial Resource Strain: Not on file  Food Insecurity: Not on file  Transportation Needs: Not on file  Physical Activity: Not on file  Stress: Not on file  Social Connections: Not on file  Intimate Partner Violence: Not on file    Outpatient Medications Prior to Visit  Medication Sig Dispense Refill   albuterol (VENTOLIN HFA) 108 (90 Base) MCG/ACT inhaler Inhale into the lungs.     meclizine (ANTIVERT) 25 MG tablet  SMARTSIG:1 Tablet(s) By Mouth Every 12 Hours PRN     triamcinolone cream (KENALOG) 0.1 % Apply topically 2 (two) times daily.     medroxyPROGESTERone  (DEPO-PROVERA ) 150 MG/ML injection Inject 1 mL (150 mg total) into the muscle once for 1 dose. 1 mL 3   No facility-administered medications prior to visit.      ROS:  Review of Systems  Constitutional:  Negative for fever.  Gastrointestinal:  Negative for blood in stool, constipation, diarrhea, nausea and vomiting.  Genitourinary:  Negative for dyspareunia, dysuria, flank pain, frequency, hematuria, urgency, vaginal bleeding, vaginal discharge and vaginal pain.  Musculoskeletal:  Negative for back pain.  Skin:  Negative for rash.   BREAST: No symptoms   OBJECTIVE:   Vitals:  BP (!) 144/81   Pulse (!) 114   Ht 5' 6 (1.676 m)   Wt 168 lb (76.2 kg)   BMI 27.12 kg/m   Physical Exam Vitals  reviewed.  Constitutional:      Appearance: She is well-developed.  Pulmonary:     Effort: Pulmonary effort is normal.  Musculoskeletal:        General: Normal range of motion.     Cervical back: Normal range of motion.  Skin:    General: Skin is warm and dry.  Neurological:     General: No focal deficit present.     Mental Status: She is alert and oriented to person, place, and time.     Cranial Nerves: No cranial nerve deficit.  Psychiatric:        Mood and Affect: Mood normal.        Behavior: Behavior normal.        Thought Content: Thought content normal.        Judgment: Judgment normal.     Assessment/Plan: Encounter for initial prescription of contraceptive pills - Plan: Norethindrone Acetate-Ethinyl Estrad-FE (MICROGESTIN 24 FE) 1-20 MG-MCG(24) tablet; BC options discussed. Pt wants to restart OCPs. Rx eRxd, start today, condoms for 7 days. F/u pnr.   Family history of breast cancer--[facility] testing discussed today and handout given. Pt to consider and f/u prn testing. Asked pt to clarify which BRCA mutation in her mom. Discussed benefits of OCPs for ovar ca prevention if has BRCA mutation; also would recommend BSO vs TL if positive (also age dependent).    Meds ordered this encounter  Medications   Norethindrone Acetate-Ethinyl Estrad-FE (MICROGESTIN 24 FE) 1-20 MG-MCG(24) tablet    Sig: Take 1 tablet by mouth daily.    Dispense:  84 tablet    Refill:  2    Supervising Provider:   LEIGH SOBER [[id number]]      Return if symptoms worsen or fail to improve.  Cameren Earnest B. Dejuan Elman, PA-C 03/16/2024 11:29 AM

## 2024-03-16 NOTE — Patient Instructions (Signed)
 I value your feedback and you entrusting [place] with your care. If you get a [place] patient survey, I would appreciate you taking the time to let us know about your experience today. Thank you! ? ? ?
